# Patient Record
Sex: Female | Born: 1950 | Race: White | Hispanic: No | Marital: Single | Smoking: Former smoker
Health system: Southern US, Community
[De-identification: ages and names within clinical notes are randomized; demographics above are authoritative.]

## PROBLEM LIST (undated history)

## (undated) DIAGNOSIS — H18513 Endothelial corneal dystrophy, bilateral: Secondary | ICD-10-CM

## (undated) DIAGNOSIS — Z862 Personal history of diseases of the blood and blood-forming organs and certain disorders involving the immune mechanism: Secondary | ICD-10-CM

## (undated) DIAGNOSIS — B0222 Postherpetic trigeminal neuralgia: Secondary | ICD-10-CM

## (undated) DIAGNOSIS — M1990 Unspecified osteoarthritis, unspecified site: Secondary | ICD-10-CM

## (undated) DIAGNOSIS — G5763 Lesion of plantar nerve, bilateral lower limbs: Secondary | ICD-10-CM

## (undated) DIAGNOSIS — M810 Age-related osteoporosis without current pathological fracture: Secondary | ICD-10-CM

## (undated) DIAGNOSIS — I7300 Raynaud's syndrome without gangrene: Secondary | ICD-10-CM

## (undated) DIAGNOSIS — K629 Disease of anus and rectum, unspecified: Secondary | ICD-10-CM

## (undated) DIAGNOSIS — F419 Anxiety disorder, unspecified: Secondary | ICD-10-CM

## (undated) DIAGNOSIS — G709 Myoneural disorder, unspecified: Secondary | ICD-10-CM

## (undated) DIAGNOSIS — F32A Depression, unspecified: Secondary | ICD-10-CM

## (undated) DIAGNOSIS — R7303 Prediabetes: Secondary | ICD-10-CM

## (undated) HISTORY — PX: COLONOSCOPY: SHX174

## (undated) HISTORY — PX: FRACTURE SURGERY: SHX138

## (undated) HISTORY — DX: Age-related osteoporosis without current pathological fracture: M81.0

## (undated) HISTORY — PX: OTHER SURGICAL HISTORY: SHX169

## (undated) HISTORY — PX: LAPAROSCOPIC TUBAL LIGATION: SUR803

## (undated) HISTORY — DX: Unspecified osteoarthritis, unspecified site: M19.90

## (undated) HISTORY — DX: Anxiety disorder, unspecified: F41.9

## (undated) HISTORY — DX: Depression, unspecified: F32.A

## (undated) HISTORY — DX: Myoneural disorder, unspecified: G70.9

## (undated) HISTORY — DX: Postherpetic trigeminal neuralgia: B02.22

## (undated) HISTORY — PX: WISDOM TOOTH EXTRACTION: SHX21

---

## 1985-12-18 HISTORY — PX: LAPAROSCOPIC TUBAL LIGATION: SUR803

## 2004-12-18 DIAGNOSIS — R569 Unspecified convulsions: Secondary | ICD-10-CM

## 2004-12-18 HISTORY — DX: Unspecified convulsions: R56.9

## 2005-12-18 HISTORY — PX: HEMORRHOID BANDING: SHX5850

## 2013-12-18 DIAGNOSIS — T8859XA Other complications of anesthesia, initial encounter: Secondary | ICD-10-CM

## 2013-12-18 DIAGNOSIS — B029 Zoster without complications: Secondary | ICD-10-CM

## 2013-12-18 HISTORY — DX: Zoster without complications: B02.9

## 2013-12-18 HISTORY — PX: OTHER SURGICAL HISTORY: SHX169

## 2013-12-18 HISTORY — DX: Other complications of anesthesia, initial encounter: T88.59XA

## 2014-10-31 DIAGNOSIS — I7300 Raynaud's syndrome without gangrene: Secondary | ICD-10-CM | POA: Insufficient documentation

## 2015-01-28 DIAGNOSIS — M722 Plantar fascial fibromatosis: Secondary | ICD-10-CM | POA: Insufficient documentation

## 2015-01-28 DIAGNOSIS — R569 Unspecified convulsions: Secondary | ICD-10-CM | POA: Insufficient documentation

## 2017-12-18 HISTORY — PX: CATARACT EXTRACTION BILATERAL W/ ANTERIOR VITRECTOMY: SHX1304

## 2018-02-07 ENCOUNTER — Other Ambulatory Visit: Payer: Self-pay | Admitting: Family Medicine

## 2018-02-07 DIAGNOSIS — E2839 Other primary ovarian failure: Secondary | ICD-10-CM

## 2018-03-22 ENCOUNTER — Other Ambulatory Visit: Payer: Self-pay | Admitting: Family Medicine

## 2018-03-22 DIAGNOSIS — Z1231 Encounter for screening mammogram for malignant neoplasm of breast: Secondary | ICD-10-CM

## 2018-04-19 ENCOUNTER — Encounter: Payer: Self-pay | Admitting: Radiology

## 2018-04-19 ENCOUNTER — Ambulatory Visit
Admission: RE | Admit: 2018-04-19 | Discharge: 2018-04-19 | Disposition: A | Discharge status: Home / Self Care | Payer: Medicare PPO | Source: Ambulatory Visit | Attending: Family Medicine | Admitting: Family Medicine

## 2018-04-19 DIAGNOSIS — E2839 Other primary ovarian failure: Secondary | ICD-10-CM

## 2018-04-19 DIAGNOSIS — Z1231 Encounter for screening mammogram for malignant neoplasm of breast: Secondary | ICD-10-CM

## 2019-09-08 ENCOUNTER — Other Ambulatory Visit: Payer: Self-pay

## 2019-09-08 ENCOUNTER — Encounter: Payer: Self-pay | Admitting: Neurology

## 2019-09-08 DIAGNOSIS — R202 Paresthesia of skin: Secondary | ICD-10-CM

## 2019-10-16 ENCOUNTER — Ambulatory Visit (INDEPENDENT_AMBULATORY_CARE_PROVIDER_SITE_OTHER): Payer: Medicare HMO | Admitting: Neurology

## 2019-10-16 ENCOUNTER — Other Ambulatory Visit: Payer: Self-pay

## 2019-10-16 DIAGNOSIS — R202 Paresthesia of skin: Secondary | ICD-10-CM | POA: Diagnosis not present

## 2019-10-16 NOTE — Procedures (Signed)
[facility]  [address]  [place], [place] [address] Tel: [phone] Fax:  [phone] Test Date:  10/16/2019  Patient: Alison Rojas DOB: 06-06-1951 Physician: Narda Amber, DO  Sex: Female Height: 5\' 6"  Ref Phys: Horald Pollen, MD  ID#: [id number] Temp: 32.0C Technician:    Patient Complaints: This is a 68 year old female referred for evaluation of bilateral wrist pain peer  NCV & EMG Findings: Extensive electrodiagnostic testing of the right upper extremity and additional studies of the left shows:  1. Bilateral median, ulnar, and mixed palmar sensory responses are within normal limits. 2. Bilateral median and ulnar motor responses are within normal limits. 3. There is no evidence of active or chronic motor axonal loss changes affecting any of the tested muscles.  Motor unit configuration and recruitment pattern is within normal limits.  Impression: This is a normal study of the upper extremities.  In particular, there is no evidence of carpal tunnel syndrome or cervical radiculopathy affecting either upper extremity.   ___________________________ Narda Amber, DO    Nerve Conduction Studies Anti Sensory Summary Table   Site NR Peak (ms) Norm Peak (ms) P-T Amp (V) Norm P-T Amp  Left Median Anti Sensory (2nd Digit)  32C  Wrist    3.5 <3.8 27.7 >10  Right Median Anti Sensory (2nd Digit)  32C  Wrist    3.2 <3.8 24.4 >10  Left Ulnar Anti Sensory (5th Digit)  32C  Wrist    3.2 <3.2 25.5 >5  Right Ulnar Anti Sensory (5th Digit)  32C  Wrist    2.9 <3.2 23.9 >5   Motor Summary Table   Site NR Onset (ms) Norm Onset (ms) O-P Amp (mV) Norm O-P Amp Site1 Site2 Delta-0 (ms) Dist (cm) Vel (m/s) Norm Vel (m/s)  Left Median Motor (Abd Poll Brev)  32C  Wrist    3.4 <4.0 10.0 >5 Elbow Wrist 5.0 27.0 54 >50  Elbow    8.4  9.5         Right Median Motor (Abd Poll Brev)  32C  Wrist    3.2 <4.0 8.5 >5 Elbow Wrist 5.2 27.0 52 >50  Elbow    8.4  8.1          Left Ulnar Motor (Abd Dig Minimi)  32C  Wrist    2.7 <3.1 10.2 >7 B Elbow Wrist 4.2 25.0 60 >50  B Elbow    6.9  10.4  A Elbow B Elbow 1.7 10.0 59 >50  A Elbow    8.6  10.1         Right Ulnar Motor (Abd Dig Minimi)  32C  Wrist    2.7 <3.1 10.3 >7 B Elbow Wrist 3.9 23.0 59 >50  B Elbow    6.6  9.8  A Elbow B Elbow 1.9 10.0 53 >50  A Elbow    8.5  9.4          Comparison Summary Table   Site NR Peak (ms) Norm Peak (ms) P-T Amp (V) Site1 Site2 Delta-P (ms) Norm Delta (ms)  Left Median/Ulnar Palm Comparison (Wrist - 8cm)  32C  Median Palm    2.1 <2.2 81.0 Median Palm Ulnar Palm 0.1   Ulnar Palm    2.0 <2.2 29.4      Right Median/Ulnar Palm Comparison (Wrist - 8cm)  32C  Median Palm    2.0 <2.2 56.6 Median Palm Ulnar Palm 0.0   Ulnar Palm    2.0 <2.2 22.2  EMG   Side Muscle Ins Act Fibs Psw Fasc Number Recrt Dur Dur. Amp Amp. Poly Poly. Comment  Right 1stDorInt Nml Nml Nml Nml Nml Nml Nml Nml Nml Nml Nml Nml N/A  Right PronatorTeres Nml Nml Nml Nml Nml Nml Nml Nml Nml Nml Nml Nml N/A  Right Biceps Nml Nml Nml Nml Nml Nml Nml Nml Nml Nml Nml Nml N/A  Right Triceps Nml Nml Nml Nml Nml Nml Nml Nml Nml Nml Nml Nml N/A  Right Deltoid Nml Nml Nml Nml Nml Nml Nml Nml Nml Nml Nml Nml N/A  Left 1stDorInt Nml Nml Nml Nml Nml Nml Nml Nml Nml Nml Nml Nml N/A  Left PronatorTeres Nml Nml Nml Nml Nml Nml Nml Nml Nml Nml Nml Nml N/A  Left Biceps Nml Nml Nml Nml Nml Nml Nml Nml Nml Nml Nml Nml N/A  Left Triceps Nml Nml Nml Nml Nml Nml Nml Nml Nml Nml Nml Nml N/A  Left Deltoid Nml Nml Nml Nml Nml Nml Nml Nml Nml Nml Nml Nml N/A      Waveforms:

## 2020-04-01 ENCOUNTER — Encounter: Payer: Self-pay | Admitting: Physical Medicine and Rehabilitation

## 2020-04-28 ENCOUNTER — Encounter: Payer: Medicare HMO | Attending: Physical Medicine and Rehabilitation | Admitting: Physical Medicine and Rehabilitation

## 2020-04-28 ENCOUNTER — Encounter: Payer: Self-pay | Admitting: Physical Medicine and Rehabilitation

## 2020-04-28 ENCOUNTER — Ambulatory Visit (HOSPITAL_COMMUNITY)
Admission: RE | Admit: 2020-04-28 | Discharge: 2020-04-28 | Disposition: A | Discharge status: Home / Self Care | Payer: Medicare HMO | Source: Ambulatory Visit | Attending: Physical Medicine and Rehabilitation | Admitting: Physical Medicine and Rehabilitation

## 2020-04-28 ENCOUNTER — Other Ambulatory Visit: Payer: Self-pay

## 2020-04-28 VITALS — BP 120/79 | HR 64 | Temp 97.7°F | Ht 67.0 in | Wt 137.0 lb

## 2020-04-28 DIAGNOSIS — G4701 Insomnia due to medical condition: Secondary | ICD-10-CM | POA: Diagnosis not present

## 2020-04-28 DIAGNOSIS — B0229 Other postherpetic nervous system involvement: Secondary | ICD-10-CM | POA: Diagnosis present

## 2020-04-28 MED ORDER — AMITRIPTYLINE HCL 10 MG PO TABS: 10.0000 mg | ORAL_TABLET | Freq: Every day | ORAL | 3 refills | Status: DC

## 2020-04-28 MED ORDER — LIDOCAINE 5 % EX PTCH: 1.0000 | MEDICATED_PATCH | CUTANEOUS | 0 refills | Status: DC

## 2020-04-28 NOTE — Progress Notes (Signed)
Subjective:    Patient ID: Alison Rojas, female    DOB: 07/10/1951, 69 y.o.   MRN: [id number]  HPI Alison Rojas is a 69 year old woman who presents to establish care for post-herpetic neuralgia.  She had shingles throughout her right arm in 2015 and required hospital admission for 1 week for treatment. Following this she has had severe right sided neuropathic pain. The pain is 10/10 on average and currently 7/10, and extends from the right side of her neck down her shoulder to her lateral epicondyl, occasionally all the way to her fingers. Denies loss of strength. The pain feels sharp, burning, stabbing, and tingling.  She takes Tramadol for the pain but does not feel that this helps her much. She has tried Lyrica and Gabapentin which caused her to have memory issues. She has never tried amitriptyline.   She also has very severe insomnia. Her PCP provided her a sample medication but she is not sure she will be able to afford this medication long term. Her insomnia started after starting Lamictal for depression 1 year ago. She is currently on Wellbutrin which is helping with her depression.   She exercises a lot every day walking 3-7 miles and doing yoga and exercise classes. She gets up early in the morning to attend these classes. She tries to eat very healthy and avoids gluten. She takes many supplements and asks if they are beneficial. She asks for any alternative treatment options I can suggest.   She has never had any imaging of her cervical spine. She asks about the possibility of a nerve block. She asks about using prescription Lidocaine patch.  Pain Inventory Average Pain 10 Pain Right Now 7 My pain is sharp, burning, stabbing and tingling  In the last 24 hours, has pain interfered with the following? General activity 8 Relation with others 0 Enjoyment of life 7 What TIME of day is your pain at its worst? evening, night  Sleep (in general) Poor  Pain is worse with: inactivity Pain  improves with: heat/ice and therapy/exercise Relief from Meds: 3  Mobility ability to climb steps?  yes do you drive?  yes  Function retired Do you have any goals in this area?  yes  Neuro/Psych depression anxiety  Prior Studies new  Physicians involved in your care new   Family History  Problem Relation Age of Onset  . Cancer Mother   . Vision loss Mother   . Vision loss Father    Social History   Socioeconomic History  . Marital status: Single    Spouse name: Not on file  . Number of children: Not on file  . Years of education: Not on file  . Highest education level: Not on file  Occupational History  . Not on file  Tobacco Use  . Smoking status: Former Research scientist (life sciences)  . Smokeless tobacco: Never Used  Substance and Sexual Activity  . Alcohol use: Not on file  . Drug use: Not on file  . Sexual activity: Not on file  Other Topics Concern  . Not on file  Social History Narrative  . Not on file   Social Determinants of Health   Financial Resource Strain:   . Difficulty of Paying Living Expenses:   Food Insecurity:   . Worried About Charity fundraiser in the Last Year:   . Arboriculturist in the Last Year:   Transportation Needs:   . Film/video editor (Medical):   Marland Kitchen Lack of  Transportation (Non-Medical):   Physical Activity:   . Days of Exercise per Week:   . Minutes of Exercise per Session:   Stress:   . Feeling of Stress :   Social Connections:   . Frequency of Communication with Friends and Family:   . Frequency of Social Gatherings with Friends and Family:   . Attends Religious Services:   . Active Member of Clubs or Organizations:   . Attends Archivist Meetings:   Marland Kitchen Marital Status:    Past Surgical History:  Procedure Laterality Date  . FRACTURE SURGERY     Past Medical History:  Diagnosis Date  . Osteoporosis    BP 120/79   Pulse 64   Temp 97.7 F (36.5 C)   Ht 5\' 7"  (1.702 m)   Wt 137 lb (62.1 kg)   SpO2 97%   BMI  21.46 kg/m   Opioid Risk Score:   Fall Risk Score:  `1  Depression screen PHQ 2/9  No flowsheet data found. - Review of Systems  Constitutional: Negative.   HENT: Negative.   Eyes: Negative.   Respiratory: Negative.   Cardiovascular: Negative.   Gastrointestinal: Negative.   Endocrine: Negative.   Genitourinary: Negative.   Musculoskeletal: Positive for arthralgias, back pain and myalgias.  Skin: Negative.   Allergic/Immunologic: Negative.   Neurological: Negative.   Hematological: Negative.   Psychiatric/Behavioral: Positive for sleep disturbance.  All other systems reviewed and are negative.      Objective:   Physical Exam Gen: no distress, normal appearing HEENT: oral mucosa pink and moist, NCAT Cardio: Reg rate Chest: normal effort, normal rate of breathing Abd: soft, non-distended Ext: no edema Skin: intact Neuro: AOx3 Musculoskeletal:  Good range of motion of neck Negative Spurling's test + tenderness to palpation over right lateral masses.  +Shooting pain down right arm with palpation of right shoulder and neck muscles 5/5 strength in upper extremities.  Psych: pleasant, normal affect     Assessment & Plan:  Alison Rojas is a 69 year old woman who presents to establish care for post-herpetic neuralgia. Also suffering from severe insomnia. Also has some memory loss which may be due to the insomnia.   -Prescribed Amitriptyline 10mg  HS for pain relief, insomnia, and depression. Can uptitrate if well tolerated. Discussed side effects.  -Prescribed Lidocaine 5% patch for left upper extremity.   -Cervical Spine XR to assess for cervical pathology that may contribute to a double-crush syndrome.  -Commended her exercise and discussed role of exercise in pain relief and inflammation reduction.  -Dicussed her nutrition and supplements. Continue fish oil and turmeric for anti-inflammatory properties. Continue to avoid gluten.   -If she does not get relief from  Amitriptyline, can consider nerve block.  All questions answered. RTC in 4 weeks.

## 2020-04-28 NOTE — Patient Instructions (Addendum)
Start Amitriptyline 10mg  at night Cervical XR Start Lidoderm patch 5%

## 2020-05-04 ENCOUNTER — Encounter: Payer: Self-pay | Admitting: *Deleted

## 2020-05-04 ENCOUNTER — Telehealth: Payer: Self-pay | Admitting: *Deleted

## 2020-05-04 DIAGNOSIS — B0229 Other postherpetic nervous system involvement: Secondary | ICD-10-CM | POA: Insufficient documentation

## 2020-05-04 NOTE — Telephone Encounter (Signed)
Prior authorization submitted via covermymeds for lidocaine patch  PA Case: [id number] Status: Approved Coverage Starts on: 12/19/2019 12:00:00 AM Coverage Ends on: 12/17/2020 12:00:00 AM

## 2020-05-21 ENCOUNTER — Other Ambulatory Visit: Payer: Self-pay | Admitting: Physical Medicine and Rehabilitation

## 2020-05-26 ENCOUNTER — Encounter: Payer: Self-pay | Admitting: Physical Medicine and Rehabilitation

## 2020-05-26 ENCOUNTER — Encounter: Payer: Medicare HMO | Attending: Physical Medicine and Rehabilitation | Admitting: Physical Medicine and Rehabilitation

## 2020-05-26 ENCOUNTER — Other Ambulatory Visit: Payer: Self-pay

## 2020-05-26 VITALS — BP 113/79 | HR 68 | Temp 97.7°F | Ht 67.0 in | Wt 136.0 lb

## 2020-05-26 DIAGNOSIS — G4701 Insomnia due to medical condition: Secondary | ICD-10-CM | POA: Diagnosis present

## 2020-05-26 DIAGNOSIS — M810 Age-related osteoporosis without current pathological fracture: Secondary | ICD-10-CM

## 2020-05-26 DIAGNOSIS — B0229 Other postherpetic nervous system involvement: Secondary | ICD-10-CM

## 2020-05-26 NOTE — Progress Notes (Signed)
Subjective:    Patient ID: Alison Rojas, female    DOB: 1950-12-25, 69 y.o.   MRN: [id number]  HPI Alison Rojas is a 69 year old woman who presents for follow-up of post-herpetic neuralgia.  She had shingles throughout her right arm in 2015 and required hospital admission for 1 week for treatment. Following this she has had severe right sided neuropathic pain. The pain is 8/10 on average and currently 3/10, down from 10/8 respectively last visit. The pain extends from the right side of her neck down her shoulder to her lateral epicondyle, occasionally all the way to her fingers. Denies loss of strength. The pain feels sharp, burning, stabbing, and tingling.  She takes Tramadol for the pain but does not feel that this helps her much. She was taking three Tramadol 50mg  per day but after starting Amitriptyline last visit she has only needed to take two Tramadol per day. She has tried Lyrica and Gabapentin which caused her to have memory issues.  She also has very severe insomnia. Her PCP provided her a sample medication but she is not sure she will be able to afford this medication long term. Her insomnia started after starting Lamictal for depression 1 year ago. She is currently on Wellbutrin which is helping with her depression. The amitriptyline is also helping her insomnia. She has been able to sleep 6 hours per night after sleeping close to 0 hours for some time. She has been taking 30mg  of the Amitriptyline per night.   She exercises a lot every day walking 3-7 miles and doing yoga and exercise classes. She gets up early in the morning to attend these classes. She tries to eat very healthy and avoids gluten. She takes many supplements and asks if they are beneficial. She asks for any alternative treatment options I can suggest.   She did not try the Lidocaine patch 5% as it cost $100.  Osteoporosis: She states that she has been diagnosed with osteoporosis and has a follow-up DEXA scan tomorrow.  She takes calcium and vitamin D and has had her levels checked by her PCP. She asks if this will alter her cervical spine findings.   Pain Inventory Average Pain 8 Pain Right Now 3 My pain is intermittent and burning  In the last 24 hours, has pain interfered with the following? General activity 0 Relation with others 0 Enjoyment of life 0 What TIME of day is your pain at its worst? night Sleep (in general) Poor  Pain is worse with: inactivity and unsure Pain improves with: therapy/exercise and medication Relief from Meds: 5  Mobility walk without assistance  Function employed # of hrs/week .  Neuro/Psych depression anxiety  Prior Studies Any changes since last visit?  no  Physicians involved in your care Any changes since last visit?  no   Family History  Problem Relation Age of Onset  . Cancer Mother   . Vision loss Mother   . Vision loss Father    Social History   Socioeconomic History  . Marital status: Single    Spouse name: Not on file  . Number of children: Not on file  . Years of education: Not on file  . Highest education level: Not on file  Occupational History  . Not on file  Tobacco Use  . Smoking status: Former Research scientist (life sciences)  . Smokeless tobacco: Never Used  Substance and Sexual Activity  . Alcohol use: Not on file  . Drug use: Not on file  .  Sexual activity: Not on file  Other Topics Concern  . Not on file  Social History Narrative  . Not on file   Social Determinants of Health   Financial Resource Strain:   . Difficulty of Paying Living Expenses:   Food Insecurity:   . Worried About Charity fundraiser in the Last Year:   . Arboriculturist in the Last Year:   Transportation Needs:   . Film/video editor (Medical):   Marland Kitchen Lack of Transportation (Non-Medical):   Physical Activity:   . Days of Exercise per Week:   . Minutes of Exercise per Session:   Stress:   . Feeling of Stress :   Social Connections:   . Frequency of  Communication with Friends and Family:   . Frequency of Social Gatherings with Friends and Family:   . Attends Religious Services:   . Active Member of Clubs or Organizations:   . Attends Archivist Meetings:   Marland Kitchen Marital Status:    Past Surgical History:  Procedure Laterality Date  . FRACTURE SURGERY     Past Medical History:  Diagnosis Date  . Osteoporosis    BP 113/79   Pulse 68   Temp 97.7 F (36.5 C)   Ht 5\' 7"  (1.702 m)   Wt 136 lb (61.7 kg)   SpO2 98%   BMI 21.30 kg/m   Opioid Risk Score:   Fall Risk Score:  `1  Depression screen PHQ 2/9  Depression screen PHQ 2/9 04/28/2020  Decreased Interest 0  Down, Depressed, Hopeless 0  PHQ - 2 Score 0  Altered sleeping 3  Tired, decreased energy 2  Change in appetite 2  Feeling bad or failure about yourself  0  Trouble concentrating 1  Moving slowly or fidgety/restless 1  Suicidal thoughts 0  PHQ-9 Score 9    Review of Systems  Constitutional: Negative.   HENT: Negative.   Eyes: Negative.   Respiratory: Negative.   Cardiovascular: Negative.   Gastrointestinal: Negative.   Endocrine: Negative.   Genitourinary: Negative.   Musculoskeletal: Positive for arthralgias.  Skin: Negative.   Allergic/Immunologic: Negative.   Neurological: Negative.   Hematological: Negative.   Psychiatric/Behavioral: Negative.   All other systems reviewed and are negative.      Objective:   Physical Exam Gen: no distress, normal appearing HEENT: oral mucosa pink and moist, NCAT Cardio: Reg rate Chest: normal effort, normal rate of breathing Abd: soft, non-distended Ext: no edema Skin: intact Neuro: AOx3 Musculoskeletal:  Good range of motion of neck Negative Spurling's test + tenderness to palpation over right lateral masses.  +Shooting pain down right arm with palpation of right shoulder and neck muscles 5/5 strength in upper extremities.  Psych: more pleasant disposition after sleeping well, normal affect     Assessment & Plan:  Alison Rojas is a 69 year old woman who presents for follow-up of post-herpetic neuralgia. Also was suffering from severe insomnia. Also had some memory loss which may be due to the insomnia.    -Prescribed Amitriptyline 10mg  HS for pain relief, insomnia, and depression last visit. Had discussed that she could uptitrate if well tolerated and she did so to 30mg  with excellent benefits for all three symptoms. Discussed side effects. She is experiencing some dry mouth but this is tolerable to her. She has to wake at night to urinate so the urinary retention side effect may be beneficial to her. Increase dose to 40mg  daily. For now she will use her  10mg  tablets. If we decide to uptitrate to 50mg , I will prescribe 50mg  tablets.    -Prescribed Lidocaine 5% patch for left upper extremity but this was cost prohibitive. Advised to use OTC 4% patches instead.   -Cervical Spine XR reviewed and results discussed with the patient. 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 This is likely an incidental finding and not correlated with her neuropathic symptoms.    -Commended her exercise and discussed role of exercise in pain relief and inflammation reduction, as well as for her osteoporosis. She has had 5 children and breast fed them, which may be contributing to her current osteoporosis. Advised regarding calcium and vitamin D rich foods. She has DEXA scan tomorrow to assess status of her osteoporosis.    -Dicussed her nutrition and supplements. Continue fish oil and turmeric for anti-inflammatory properties. Continue to avoid gluten.    All questions answered. RTC in 4 weeks.

## 2020-07-26 ENCOUNTER — Telehealth: Payer: Self-pay | Admitting: *Deleted

## 2020-07-26 MED ORDER — AMITRIPTYLINE HCL 50 MG PO TABS: 50.0000 mg | ORAL_TABLET | Freq: Every day | ORAL | 5 refills | Status: DC

## 2020-07-26 NOTE — Telephone Encounter (Signed)
[name] called and is requesting a refill on her amitriptyline. She is asking for the higher dose.  It was discussed at last visit with Dr Ranell Patrick. She is requesting the 50 mg dose be sent to her pharmacy.

## 2020-07-26 NOTE — Telephone Encounter (Signed)
Renewed Elavil Rx- at 50 mg QHS-

## 2020-07-27 NOTE — Telephone Encounter (Signed)
Notified. 

## 2020-08-21 ENCOUNTER — Other Ambulatory Visit: Payer: Self-pay | Admitting: Physical Medicine and Rehabilitation

## 2020-08-25 ENCOUNTER — Encounter: Payer: Medicare HMO | Attending: Physical Medicine and Rehabilitation | Admitting: Physical Medicine and Rehabilitation

## 2020-08-25 ENCOUNTER — Encounter: Payer: Self-pay | Admitting: Physical Medicine and Rehabilitation

## 2020-08-25 ENCOUNTER — Other Ambulatory Visit: Payer: Self-pay

## 2020-08-25 VITALS — BP 124/80 | HR 63 | Temp 98.2°F | Ht 67.0 in | Wt 132.4 lb

## 2020-08-25 DIAGNOSIS — G4701 Insomnia due to medical condition: Secondary | ICD-10-CM | POA: Insufficient documentation

## 2020-08-25 DIAGNOSIS — M810 Age-related osteoporosis without current pathological fracture: Secondary | ICD-10-CM

## 2020-08-25 DIAGNOSIS — B0229 Other postherpetic nervous system involvement: Secondary | ICD-10-CM | POA: Diagnosis not present

## 2020-08-25 MED ORDER — DESIPRAMINE HCL 25 MG PO TABS: 25.0000 mg | ORAL_TABLET | Freq: Every day | ORAL | 3 refills | Status: DC

## 2020-08-25 NOTE — Progress Notes (Signed)
Subjective:    Patient ID: Alison Rojas, female    DOB: 1951-07-25, 69 y.o.   MRN: [id number]  HPI  Alison Rojas is a 69 year old woman who presents for follow-up of post-herpetic neuralgia.   She does yoga at 7:15 in the morning with others. She cannot take Amitriptyline the nights before. Last week she did not take the Amitritptyline at all. She takes it 5 days per week. Definitely on weekends.   She also is experiencing constipation with the Amitriptyline.   When she takes Miralax and psylium she is able to have a BM every morning.   She still wakes up 2-3 times per night wit the Amitriptyline.   Pain Inventory Average Pain 7 Pain Right Now 3 My pain is constant, sharp and burning  In the last 24 hours, has pain interfered with the following? General activity 3 Relation with others 0 Enjoyment of life 5 What TIME of day is your pain at its worst? evening and night Sleep (in general) Poor  Pain is worse with: inactivity Pain improves with: rest and therapy/exercise Relief from Meds: 5  Family History  Problem Relation Age of Onset   Cancer Mother    Vision loss Mother    Vision loss Father    Social History   Socioeconomic History   Marital status: Single    Spouse name: Not on file   Number of children: Not on file   Years of education: Not on file   Highest education level: Not on file  Occupational History   Not on file  Tobacco Use   Smoking status: Former Smoker   Smokeless tobacco: Never Used  Scientific laboratory technician Use: Never used  Substance and Sexual Activity   Alcohol use: Yes   Drug use: Not Currently   Sexual activity: Not on file  Other Topics Concern   Not on file  Social History Narrative   Not on file   Social Determinants of Health   Financial Resource Strain:    Difficulty of Paying Living Expenses: Not on file  Food Insecurity:    Worried About Charity fundraiser in the Last Year: Not on file   [employer] of Food in  the Last Year: Not on file  Transportation Needs:    Lack of Transportation (Medical): Not on file   Lack of Transportation (Non-Medical): Not on file  Physical Activity:    Days of Exercise per Week: Not on file   Minutes of Exercise per Session: Not on file  Stress:    Feeling of Stress : Not on file  Social Connections:    Frequency of Communication with Friends and Family: Not on file   Frequency of Social Gatherings with Friends and Family: Not on file   Attends Religious Services: Not on file   Active Member of Clubs or Organizations: Not on file   Attends Archivist Meetings: Not on file   Marital Status: Not on file   Past Surgical History:  Procedure Laterality Date   FRACTURE SURGERY     Past Surgical History:  Procedure Laterality Date   FRACTURE SURGERY     Past Medical History:  Diagnosis Date   Osteoporosis    BP 124/80    Pulse 63    Temp 98.2 F (36.8 C)    Ht 5\' 7"  (1.702 m)    Wt 132 lb 6.4 oz (60.1 kg)    SpO2 97%    BMI  20.74 kg/m   Opioid Risk Score:   Fall Risk Score:  `1  Depression screen PHQ 2/9  Depression screen PHQ 2/9 04/28/2020  Decreased Interest 0  Down, Depressed, Hopeless 0  PHQ - 2 Score 0  Altered sleeping 3  Tired, decreased energy 2  Change in appetite 2  Feeling bad or failure about yourself  0  Trouble concentrating 1  Moving slowly or fidgety/restless 1  Suicidal thoughts 0  PHQ-9 Score 9   Review of Systems  HENT: Negative.   Eyes: Negative.   Respiratory: Negative.   Cardiovascular: Negative.   Endocrine: Negative.   Genitourinary: Negative.   Musculoskeletal: Positive for joint swelling and neck pain.       Left arm & shoulder, left hand  Skin: Negative.   Allergic/Immunologic: Negative.   Neurological: Negative.   Hematological: Negative.   Psychiatric/Behavioral: Negative.   All other systems reviewed and are negative.      Objective:   Physical Exam Gen: no distress, normal  appearing HEENT: oral mucosa pink and moist, NCAT Cardio: Reg rate Chest: normal effort, normal rate of breathing Abd: soft, non-distended Ext: no edema Neuro:AOx3 Musculoskeletal: Good range of motion of neck Negative Spurling's test + tenderness to palpation over right lateral masses.  +Shooting pain down right arm with palpation of right shoulder and neck muscles 5/5 strength in upper extremities. Psych: more pleasant disposition after sleeping well, normal affect    Assessment & Plan:  Alison Rojas is a 69 year old woman who presents for follow-up of post-herpetic neuralgia. Also was suffering from severe insomnia. Also had some memory loss which may be due to the insomnia.   1) Post herpetic neuralgia 2) Insomnia -Since Amitriptyline is constipating patient, will trial Desipramine instead. Prescribed 25mg  tablets but she may take up to 50mg  if needed. This should help with pain relief and insomnia.  -Discussed [employer] PNS system as an option of pain treatment via neuromodulation. Provided following link for patient to learn more about the system: [email or web address] Will schedule next visit for her left sided post-herpetic neuralgia. Her goals are pain relief and to get off her Tramadol.   -Prescribed Lidocaine 5% patch for left upper extremity but this was cost prohibitive. Advised to use OTC 4% patches instead. This has been working well for her.   -Cervical Spine XR reviewed and results discussed with the patient. 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 This is likely an incidental finding and not correlated with her neuropathic symptoms.   -Commended her exercise and discussed role of exercise in pain relief and inflammation reduction, as well as for her osteoporosis. She has had 5 children and breast fed them, which may be contributing to her current osteoporosis. Advised regarding calcium and  vitamin D rich foods.    -Dicussed her nutrition and supplements. Continue fish oil and turmeric for anti-inflammatory properties. Continue to avoid gluten.   -Discussed sleepy hygiene. She will be getting blue blocker glasses soon  3) Constipation: prunes, high fiber diet, colace TID PRN and Senna HS prn. Advised regarding risk of dependence on laxatives.     All questions answered. RTC in 4 weeks.

## 2020-08-25 NOTE — Patient Instructions (Signed)
[email or web address]

## 2020-09-29 ENCOUNTER — Other Ambulatory Visit: Payer: Self-pay

## 2020-09-29 ENCOUNTER — Encounter: Payer: Medicare HMO | Attending: Physical Medicine and Rehabilitation | Admitting: Physical Medicine and Rehabilitation

## 2020-09-29 ENCOUNTER — Encounter: Payer: Self-pay | Admitting: Physical Medicine and Rehabilitation

## 2020-09-29 VITALS — BP 117/75 | HR 71 | Temp 98.0°F | Ht 67.0 in | Wt 135.0 lb

## 2020-09-29 DIAGNOSIS — B0229 Other postherpetic nervous system involvement: Secondary | ICD-10-CM | POA: Insufficient documentation

## 2020-09-29 DIAGNOSIS — K5903 Drug induced constipation: Secondary | ICD-10-CM | POA: Diagnosis present

## 2020-09-29 DIAGNOSIS — G4701 Insomnia due to medical condition: Secondary | ICD-10-CM | POA: Diagnosis present

## 2020-09-29 NOTE — Progress Notes (Signed)
Subjective:    Patient ID: Alison Rojas, female    DOB: 1951/04/27, 69 y.o.   MRN: [id number]  HPI  Alison Rojas is a 69 year old woman who presents for follow-up of post-herpetic neuralgia.   Her current pain is well controlled at 2/10 with the desipramine. When she exercises she feels no pain. When she stops the pain resumes and it is present especially at night. The Desipramine helps with the pain and also helps her to sleep but makes her very constipated. She would like to know what her copay is for the SPRINT PNS before pursuing it.   She does yoga at 7:15 in the morning with friends.   She does take fiber for her constipation but it is still quite bothersome to her.   When she takes Miralax and psylium she is able to have a BM every morning.   She still woke frequently with the Desimpramine.   Pain Inventory Average Pain 7 Pain Right Now 3 My pain is constant, sharp and burning  In the last 24 hours, has pain interfered with the following? General activity 3 Relation with others 0 Enjoyment of life 5 What TIME of day is your pain at its worst? evening and night Sleep (in general) Poor  Pain is worse with: inactivity Pain improves with: rest and therapy/exercise Relief from Meds: 5  Family History  Problem Relation Age of Onset  . Cancer Mother   . Vision loss Mother   . Vision loss Father    Social History   Socioeconomic History  . Marital status: Single    Spouse name: Not on file  . Number of children: Not on file  . Years of education: Not on file  . Highest education level: Not on file  Occupational History  . Not on file  Tobacco Use  . Smoking status: Former Research scientist (life sciences)  . Smokeless tobacco: Never Used  Vaping Use  . Vaping Use: Never used  Substance and Sexual Activity  . Alcohol use: Yes  . Drug use: Not Currently  . Sexual activity: Not on file  Other Topics Concern  . Not on file  Social History Narrative  . Not on file   Social Determinants of  Health   Financial Resource Strain:   . Difficulty of Paying Living Expenses: Not on file  Food Insecurity:   . Worried About Charity fundraiser in the Last Year: Not on file  . Ran Out of Food in the Last Year: Not on file  Transportation Needs:   . Lack of Transportation (Medical): Not on file  . Lack of Transportation (Non-Medical): Not on file  Physical Activity:   . Days of Exercise per Week: Not on file  . Minutes of Exercise per Session: Not on file  Stress:   . Feeling of Stress : Not on file  Social Connections:   . Frequency of Communication with Friends and Family: Not on file  . Frequency of Social Gatherings with Friends and Family: Not on file  . Attends Religious Services: Not on file  . Active Member of Clubs or Organizations: Not on file  . Attends Archivist Meetings: Not on file  . Marital Status: Not on file   Past Surgical History:  Procedure Laterality Date  . FRACTURE SURGERY     Past Surgical History:  Procedure Laterality Date  . FRACTURE SURGERY     Past Medical History:  Diagnosis Date  . Osteoporosis  BP 117/75   Pulse 71   Temp 98 F (36.7 C)   Ht 5\' 7"  (1.702 m)   Wt 135 lb (61.2 kg)   SpO2 97%   BMI 21.14 kg/m   Opioid Risk Score:   Fall Risk Score:  `1  Depression screen PHQ 2/9  Depression screen [facility] 2/9 08/25/2020 04/28/2020  Decreased Interest 0 0  Down, Depressed, Hopeless 0 0  PHQ - 2 Score 0 0  Altered sleeping - 3  Tired, decreased energy - 2  Change in appetite - 2  Feeling bad or failure about yourself  - 0  Trouble concentrating - 1  Moving slowly or fidgety/restless - 1  Suicidal thoughts - 0  PHQ-9 Score - 9   Review of Systems  HENT: Negative.   Eyes: Negative.   Respiratory: Negative.   Cardiovascular: Negative.   Endocrine: Negative.   Genitourinary: Negative.   Musculoskeletal: Positive for joint swelling and neck pain.       Left arm & shoulder, left hand  Skin: Negative.     Allergic/Immunologic: Negative.   Neurological: Negative.   Hematological: Negative.   Psychiatric/Behavioral: Negative.   All other systems reviewed and are negative.      Objective:   Physical Exam Gen: no distress, normal appearing HEENT: oral mucosa pink and moist, NCAT Cardio: Reg rate Chest: normal effort, normal rate of breathing Abd: soft, non-distended Ext: no edema Skin: intact Neuro:AOx3 Musculoskeletal: Good range of motion of neck Negative Spurling's test + tenderness to palpation over right lateral masses.  +Shooting pain down right arm with palpation of right shoulder and neck muscles 5/5 strength in upper extremities. Psych: more pleasant disposition after sleeping well, normal affect     Assessment & Plan:  Alison Rojas is a 69 year old woman who presents for follow-up of post-herpetic neuralgia. Also was suffering from severe insomnia. Also had some memory loss which may be due to the insomnia.   1) Post herpetic neuralgia 2) Insomnia -Desipramine also constipated Alison Rojas. She is going to stop taking the Desipramine and see if pain recurs. -She called her insurance company to determine copay and would like to proceed with SPRINT PNS. Will schedule for next visit.   -Discussed Sprint PNS system as an option of pain treatment via neuromodulation. Provided following link for patient to learn more about the system: [email or web address].sprtherapeutics.com/. Will schedule next visit for her left sided post-herpetic neuralgia. Her goals are pain relief and to get off her Tramadol and Desipramine which are constipating her. Will target axillary nerve.  -Prescribed Lidocaine 5% patch for left upper extremity but this was cost prohibitive. Advised to use OTC 4% patches instead. This has been working well for her.   -Cervical Spine XR reviewed and results discussed with the patient. 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 This is likely an incidental finding and not correlated with her neuropathic symptoms.   -Commended her exercise and discussed role of exercise in pain relief and inflammation reduction, as well as for her osteoporosis. She has had 5 children and breast fed them, which may be contributing to her current osteoporosis. Advised regarding calcium and vitamin D rich foods.    -Dicussed her nutrition and supplements. Continue fish oil and turmeric for anti-inflammatory properties. Continue to avoid gluten.   -Discussed sleepy hygiene. She will be getting blue blocker glasses soon.  3) Constipation: prunes, high fiber diet, colace TID  PRN and Senna HS prn. Advised regarding risk of dependence on laxatives.     All questions answered. RTC in 4 weeks.

## 2020-10-26 ENCOUNTER — Encounter: Payer: Medicare HMO | Attending: Physical Medicine and Rehabilitation | Admitting: Physical Medicine and Rehabilitation

## 2020-10-26 ENCOUNTER — Other Ambulatory Visit: Payer: Self-pay

## 2020-10-26 ENCOUNTER — Encounter: Payer: Self-pay | Admitting: Physical Medicine and Rehabilitation

## 2020-10-26 VITALS — BP 118/82 | HR 64 | Temp 98.0°F | Ht 67.0 in | Wt 138.0 lb

## 2020-10-26 DIAGNOSIS — B0229 Other postherpetic nervous system involvement: Secondary | ICD-10-CM | POA: Diagnosis not present

## 2020-10-26 NOTE — Progress Notes (Signed)
Procedure: SPRINT PNS placement. Location: Left axillary nerve Indication: Post-herpetic neuralgia x6 years that has failed conservative care  Risks and benefits of procedure were discussed with the patient and consent was obtained.  The site was anesthetized with 1 cc lidocaine. Needle injected lateral to medial and stimulation resulted in deltoid movement and paresthesias in the appropriate distribution. Pulse generator was placed on the lateral arm.  Patient tolerated the procedure well and post-procedure instructions were given.

## 2020-12-30 ENCOUNTER — Ambulatory Visit: Payer: Medicare HMO | Admitting: Physical Medicine and Rehabilitation

## 2021-01-12 ENCOUNTER — Telehealth: Payer: Self-pay

## 2021-01-12 DIAGNOSIS — G4701 Insomnia due to medical condition: Secondary | ICD-10-CM

## 2021-01-12 MED ORDER — AMITRIPTYLINE HCL 50 MG PO TABS: ORAL_TABLET | ORAL | 2 refills | Status: DC

## 2021-01-12 NOTE — Telephone Encounter (Signed)
Refill request Amitriptyline

## 2021-05-10 DIAGNOSIS — H18513 Endothelial corneal dystrophy, bilateral: Secondary | ICD-10-CM | POA: Insufficient documentation

## 2021-09-21 LAB — COLOGUARD: COLOGUARD: POSITIVE — AB

## 2021-10-06 ENCOUNTER — Ambulatory Visit (AMBULATORY_SURGERY_CENTER): Payer: Medicare Other

## 2021-10-06 ENCOUNTER — Other Ambulatory Visit: Payer: Self-pay

## 2021-10-06 VITALS — Ht 67.0 in | Wt 144.0 lb

## 2021-10-06 DIAGNOSIS — R195 Other fecal abnormalities: Secondary | ICD-10-CM

## 2021-10-06 MED ORDER — NA SULFATE-K SULFATE-MG SULF 17.5-3.13-1.6 GM/177ML PO SOLN: 1.0000 | Freq: Once | ORAL | 0 refills | Status: AC

## 2021-10-06 NOTE — Progress Notes (Signed)
Pre visit completed via phone call; Patient verified name, DOB, and address; No egg or soy allergy known to patient.  No issues known to pt with past sedation with any surgeries or procedures Patient denies ever being told they had issues or difficulty with intubation  No FH of Malignant Hyperthermia Pt is not on diet pills Pt is not on  home 02  Pt is not on blood thinners  Pt reports issues with constipation at this time- patient reports taking Dulcolax, increases fluids, increase veggies, and exercises; No A fib or A flutter Pt is fully vaccinated for Covid x 2; NO PA's for preps discussed with pt in PV today  Discussed with pt there will be an out-of-pocket cost for prep and that varies from $0 to 70 +  dollars - pt verbalized understanding  Due to the COVID-19 pandemic we are asking patients to follow certain guidelines in PV and the [place]   Pt aware of COVID protocols and [facility] guidelines

## 2021-10-21 ENCOUNTER — Encounter: Payer: Medicare HMO | Admitting: Gastroenterology

## 2021-10-27 ENCOUNTER — Encounter: Payer: Self-pay | Admitting: Gastroenterology

## 2021-11-24 ENCOUNTER — Ambulatory Visit (AMBULATORY_SURGERY_CENTER): Payer: Medicare Other | Admitting: Gastroenterology

## 2021-11-24 ENCOUNTER — Encounter: Payer: Self-pay | Admitting: Gastroenterology

## 2021-11-24 VITALS — BP 107/67 | HR 48 | Temp 97.5°F | Resp 13 | Ht 67.0 in | Wt 144.0 lb

## 2021-11-24 DIAGNOSIS — K5730 Diverticulosis of large intestine without perforation or abscess without bleeding: Secondary | ICD-10-CM

## 2021-11-24 DIAGNOSIS — R195 Other fecal abnormalities: Secondary | ICD-10-CM | POA: Diagnosis not present

## 2021-11-24 DIAGNOSIS — D128 Benign neoplasm of rectum: Secondary | ICD-10-CM | POA: Diagnosis not present

## 2021-11-24 DIAGNOSIS — D120 Benign neoplasm of cecum: Secondary | ICD-10-CM

## 2021-11-24 DIAGNOSIS — K6289 Other specified diseases of anus and rectum: Secondary | ICD-10-CM

## 2021-11-24 DIAGNOSIS — K648 Other hemorrhoids: Secondary | ICD-10-CM | POA: Diagnosis not present

## 2021-11-24 HISTORY — PX: COLONOSCOPY: SHX174

## 2021-11-24 MED ORDER — SODIUM CHLORIDE 0.9 % IV SOLN: 500.0000 mL | Freq: Once | INTRAVENOUS | Status: DC

## 2021-11-24 NOTE — Progress Notes (Signed)
PT taken to PACU. Monitors in place. VSS. Report given to RN. 

## 2021-11-24 NOTE — Op Note (Signed)
[place] Patient Name: Alison Rojas Procedure Date: 11/24/2021 11:51 AM MRN: [id number] Endoscopist: Mauri Pole , MD Age: 70 Referring MD:  Date of Birth: 1951-02-05 Gender: Female Account #: [id number] Procedure:                Colonoscopy Indications:              Positive Cologuard test Medicines:                Monitored Anesthesia Care Procedure:                Pre-Anesthesia Assessment:                           - Prior to the procedure, a History and Physical                            was performed, and patient medications and                            allergies were reviewed. The patient's tolerance of                            previous anesthesia was also reviewed. The risks                            and benefits of the procedure and the sedation                            options and risks were discussed with the patient.                            All questions were answered, and informed consent                            was obtained. Prior Anticoagulants: The patient has                            taken no previous anticoagulant or antiplatelet                            agents. ASA Grade Assessment: II - A patient with                            mild systemic disease. After reviewing the risks                            and benefits, the patient was deemed in                            satisfactory condition to undergo the procedure.                           After obtaining informed consent, the colonoscope  was passed under direct vision. Throughout the                            procedure, the patient's blood pressure, pulse, and                            oxygen saturations were monitored continuously. The                            PCF-HQ190L Colonoscope was introduced through the                            anus and advanced to the the cecum, identified by                            appendiceal orifice and ileocecal  valve. The                            colonoscopy was performed without difficulty. The                            patient tolerated the procedure well. The quality                            of the bowel preparation was good. The terminal                            ileum, ileocecal valve, appendiceal orifice, and                            rectum were photographed. Scope In: 12:04:27 PM Scope Out: 12:27:52 PM Scope Withdrawal Time: 0 hours 13 minutes 3 seconds  Total Procedure Duration: 0 hours 23 minutes 25 seconds  Findings:                 The digital rectal exam revealed a rectal mass.                           Scattered small-mouthed diverticula were found in                            the sigmoid colon, descending colon, transverse                            colon and ascending colon.                           A 4 mm polyp was found in the ileocecal valve. The                            polyp was sessile. The polyp was removed with a                            cold snare. Resection and retrieval were complete.  An infiltrative non-obstructing mass was found in                            the distal rectum. The mass was partially                            circumferential (involving one-third of the lumen                            circumference). The mass measured one cm in length.                            In addition, its diameter measured fifteen mm. No                            bleeding was present. Biopsies were taken with a                            cold forceps for histology. Proximal fold area was                            tattooed with an injection of 1 mL of Spot (carbon                            black).                           Non-bleeding external and internal hemorrhoids were                            found during retroflexion. The hemorrhoids were                            small. Complications:            No immediate  complications. Estimated Blood Loss:     Estimated blood loss was minimal. Impression:               - Rectal mass.                           - Diverticulosis in the sigmoid colon, in the                            descending colon, in the transverse colon and in                            the ascending colon.                           - One 4 mm polyp at the ileocecal valve, removed                            with a cold snare. Resected and retrieved.                           -  Rule out malignancy, tumor in the distal rectum.                            Biopsied. Tattooed.                           - Non-bleeding external and internal hemorrhoids. Recommendation:           - Patient has a contact number available for                            emergencies. The signs and symptoms of potential                            delayed complications were discussed with the                            patient. Return to normal activities tomorrow.                            Written discharge instructions were provided to the                            patient.                           - Resume previous diet.                           - Continue present medications.                           - Await pathology results.                           - Repeat colonoscopy date to be determined after                            pending pathology results are reviewed for                            surveillance based on pathology results. Mauri Pole, MD 11/24/2021 12:35:24 PM This report has been signed electronically.

## 2021-11-24 NOTE — Progress Notes (Signed)
Called to room to assist during endoscopic procedure.  Patient ID and intended procedure confirmed with present staff. Received instructions for my participation in the procedure from the performing physician.  

## 2021-11-24 NOTE — Progress Notes (Signed)
VS completed by DT.   Medical and surgical history reviewed and updated.  

## 2021-11-24 NOTE — Progress Notes (Signed)
[facility] History and Physical   Primary Care Physician:  Hayden Rasmussen, MD   Reason for Procedure:  Positive Cologuard  Plan:    Colonoscopy with possible interventions as needed     HPI: Alison Rojas is a very pleasant 70 y.o. female here for colonoscopy for positive cologuard. Denies any nausea, vomiting, abdominal pain, melena or bright red blood per rectum  The risks and benefits as well as alternatives of endoscopic procedure(s) have been discussed and reviewed. All questions answered. The patient agrees to proceed.    Past Medical History:  Diagnosis Date   Anxiety    on meds   Arthritis    Depression    on meds   Neuromuscular disorder ([place])    Osteoporosis    Post-herpetic trigeminal neuralgia     Past Surgical History:  Procedure Laterality Date   HEMORRHOID BANDING  2007   LAPAROSCOPIC TUBAL LIGATION     WISDOM TOOTH EXTRACTION      Prior to Admission medications   Medication Sig Start Date End Date Taking? Authorizing Provider  buPROPion (WELLBUTRIN XL) 150 MG 24 hr tablet Take 150 mg by mouth every morning. 10/04/21   [provider]  buPROPion (WELLBUTRIN XL) 300 MG 24 hr tablet Take 300 mg by mouth daily.    [provider]  CALCIUM CITRATE-VITAMIN D PO Take 1 tablet by mouth daily at 6 (six) AM.    [provider]  Ferrous Sulfate (IRON PO) Take 1 tablet by mouth daily at 6 (six) AM.    [provider]  Multiple Vitamin (MULTIVITAMIN) capsule Take 1 capsule by mouth daily. gummie    [provider]  Multiple Vitamins-Minerals (PRESERVISION AREDS PO) Take 1 capsule by mouth daily at 6 (six) AM.    [provider]  Omega-3 Fatty Acids (FISH OIL) 1000 MG CAPS Take 1 capsule by mouth daily at 6 (six) AM.    [provider]  prednisoLONE acetate (PRED FORTE) 1 % ophthalmic suspension Place 1 drop into the left eye 3 (three) times daily. 08/13/21   [provider]  sodium  chloride (MURO 128) 5 % ophthalmic solution Place 1 drop into both eyes 3 (three) times daily.    [provider]  vitamin B-12 (CYANOCOBALAMIN) 500 MCG tablet Take 500 mcg by mouth daily.    [provider]    Current Outpatient Medications  Medication Sig Dispense Refill   buPROPion (WELLBUTRIN XL) 150 MG 24 hr tablet Take 150 mg by mouth every morning.     buPROPion (WELLBUTRIN XL) 300 MG 24 hr tablet Take 300 mg by mouth daily.     CALCIUM CITRATE-VITAMIN D PO Take 1 tablet by mouth daily at 6 (six) AM.     Ferrous Sulfate (IRON PO) Take 1 tablet by mouth daily at 6 (six) AM.     Multiple Vitamin (MULTIVITAMIN) capsule Take 1 capsule by mouth daily. gummie     Multiple Vitamins-Minerals (PRESERVISION AREDS PO) Take 1 capsule by mouth daily at 6 (six) AM.     Omega-3 Fatty Acids (FISH OIL) 1000 MG CAPS Take 1 capsule by mouth daily at 6 (six) AM.     prednisoLONE acetate (PRED FORTE) 1 % ophthalmic suspension Place 1 drop into the left eye 3 (three) times daily.     sodium chloride (MURO 128) 5 % ophthalmic solution Place 1 drop into both eyes 3 (three) times daily.     vitamin B-12 (CYANOCOBALAMIN) 500 MCG tablet Take  500 mcg by mouth daily.     No current facility-administered medications for this visit.    Allergies as of 11/24/2021 - Review Complete 10/06/2021  Allergen Reaction Noted   Codeine Shortness Of Breath 07/31/2018   Dairycare [lactase-lactobacillus] Diarrhea and Nausea And Vomiting 10/06/2021   Gluten meal Nausea And Vomiting 07/25/2021    Family History  Problem Relation Age of Onset   Cancer Mother    Vision loss Mother    Vision loss Father    Colon polyps Neg Hx    Colon cancer Neg Hx    Esophageal cancer Neg Hx    Rectal cancer Neg Hx    Stomach cancer Neg Hx     Social History   Socioeconomic History   Marital status: Single    Spouse name: Not on file   Number of children: Not on file   Years of education: Not on file   Highest  education level: Not on file  Occupational History   Not on file  Tobacco Use   Smoking status: Former   Smokeless tobacco: Never  Vaping Use   Vaping Use: Never used  Substance and Sexual Activity   Alcohol use: Not Currently   Drug use: Not Currently    Frequency: 7.0 times per week    Types: Marijuana   Sexual activity: Not on file  Other Topics Concern   Not on file  Social History Narrative   Not on file   Social Determinants of Health   Financial Resource Strain: Not on file  Food Insecurity: Not on file  Transportation Needs: Not on file  Physical Activity: Not on file  Stress: Not on file  Social Connections: Not on file  Intimate Partner Violence: Not on file    Review of Systems:  All other review of systems negative except as mentioned in the HPI.  Physical Exam: Vital signs in last 24 hours: BP 95/66   Pulse 65   Temp (!) 97.5 F (36.4 C) (Temporal)   Ht 5\' 7"  (1.702 m)   Wt 144 lb (65.3 kg)   SpO2 99%   BMI 22.55 kg/m  General:   Alert, NAD Lungs:  Clear .   Heart:  Regular rate and rhythm Abdomen:  Soft, nontender and nondistended. Neuro/Psych:  Alert and cooperative. Normal mood and affect. A and O x 3  Reviewed labs, radiology imaging, old records and pertinent past GI work up  Patient is appropriate for planned procedure(s) and anesthesia in an ambulatory setting   K. Denzil Magnuson , MD [phone]

## 2021-11-24 NOTE — Patient Instructions (Signed)
Thank you for allowing [place] to take care of you today.  Please review all handouts given to you by your recovery nurse.    YOU HAD AN ENDOSCOPIC PROCEDURE TODAY AT [facility]:   Refer to the procedure report that was given to you for any specific questions about what was found during the examination.  If the procedure report does not answer your questions, please call your gastroenterologist to clarify.  If you requested that your care partner not be given the details of your procedure findings, then the procedure report has been included in a sealed envelope for you to review at your convenience later.  YOU SHOULD EXPECT: Some feelings of bloating in the abdomen. Passage of more gas than usual.  Walking can help get rid of the air that was put into your GI tract during the procedure and reduce the bloating. If you had a lower endoscopy (such as a colonoscopy or flexible sigmoidoscopy) you may notice spotting of blood in your stool or on the toilet paper. If you underwent a bowel prep for your procedure, you may not have a normal bowel movement for a few days.  Please Note:  You might notice some irritation and congestion in your nose or some drainage.  This is from the oxygen used during your procedure.  There is no need for concern and it should clear up in a day or so.  SYMPTOMS TO REPORT IMMEDIATELY:  Following lower endoscopy (colonoscopy or flexible sigmoidoscopy):  Excessive amounts of blood in the stool  Significant tenderness or worsening of abdominal pains  Swelling of the abdomen that is new, acute  Fever of 100F or higher   For urgent or emergent issues, a gastroenterologist can be reached at any hour by calling [phone]. Do not use MyChart messaging for urgent concerns.    DIET:  We do recommend a small meal at first, but then you may proceed to your regular diet.  Drink plenty of fluids but you should avoid alcoholic beverages for 24 hours.  ACTIVITY:  You  should plan to take it easy for the rest of today and you should NOT DRIVE or use heavy machinery until tomorrow (because of the sedation medicines used during the test).    FOLLOW UP: Our staff will call the number listed on your records 48-72 hours following your procedure to check on you and address any questions or concerns that you may have regarding the information given to you following your procedure. If we do not reach you, we will leave a message.  We will attempt to reach you two times.  During this call, we will ask if you have developed any symptoms of COVID 19. If you develop any symptoms (ie: fever, flu-like symptoms, shortness of breath, cough etc.) before then, please call [phone].  If you test positive for Covid 19 in the 2 weeks post procedure, please call and report this information to [place].    If any biopsies were taken you will be contacted by phone or by letter within the next 1-3 weeks.  Please call us at [phone] if you have not heard about the biopsies in 3 weeks.    SIGNATURES/CONFIDENTIALITY: You and/or your care partner have signed paperwork which will be entered into your electronic medical record.  These signatures attest to the fact that that the information above on your After Visit Summary has been reviewed and is understood.  Full responsibility of the confidentiality of this discharge  information lies with you and/or your care-partner. 

## 2021-11-28 ENCOUNTER — Telehealth: Payer: Self-pay

## 2021-11-28 ENCOUNTER — Telehealth: Payer: Self-pay | Admitting: *Deleted

## 2021-11-28 ENCOUNTER — Encounter: Payer: Self-pay | Admitting: Gastroenterology

## 2021-11-28 DIAGNOSIS — K6289 Other specified diseases of anus and rectum: Secondary | ICD-10-CM

## 2021-11-28 DIAGNOSIS — D374 Neoplasm of uncertain behavior of colon: Secondary | ICD-10-CM

## 2021-11-28 NOTE — Telephone Encounter (Signed)
Second attempt follow up call to pt, no answer.  

## 2021-11-28 NOTE — Telephone Encounter (Signed)
First post procedure follow up call, no answer 

## 2021-11-28 NOTE — Telephone Encounter (Addendum)
Left message for pt to call back also l/m about her MRI that is tomorrow 12/13 at 4pm she needs to arrive 15 minutes early NPO 4 hours

## 2021-11-28 NOTE — Telephone Encounter (Signed)
As part of our Prior Authorization Reduction program, [employer] no longer requires prior authorization for CT, MRI/MRA and transthoracic echocardiography procedures for Medicare members effective 12/18/2016*. If you have general questions about prior authorization requirements, please call customer service at [phone]. *Medicare members include Medicare Advantage, Duals, MMP and [employer] SCO benefit plans.    This is about her authorization for the MRI

## 2021-11-28 NOTE — Telephone Encounter (Signed)
Called patient and discussed results.  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  Will need to exclude any submucosal issue involvement. Please schedule urgent MRI pelvis and referral to colorectal surgeon ([facility]) for removal of the lesion.  Recall colonoscopy in 1 year

## 2021-11-29 ENCOUNTER — Other Ambulatory Visit: Payer: Self-pay

## 2021-11-29 ENCOUNTER — Ambulatory Visit (HOSPITAL_COMMUNITY)
Admission: RE | Admit: 2021-11-29 | Discharge: 2021-11-29 | Disposition: A | Discharge status: Home / Self Care | Payer: Medicare Other | Source: Ambulatory Visit | Attending: Gastroenterology | Admitting: Gastroenterology

## 2021-11-29 DIAGNOSIS — D374 Neoplasm of uncertain behavior of colon: Secondary | ICD-10-CM | POA: Diagnosis present

## 2021-11-29 DIAGNOSIS — K6289 Other specified diseases of anus and rectum: Secondary | ICD-10-CM | POA: Insufficient documentation

## 2021-11-29 MED ORDER — GADOBUTROL 1 MMOL/ML IV SOLN
7.0000 mL | Freq: Once | INTRAVENOUS | Status: AC | PRN
  Administered 2021-11-29: 7 mL via INTRAVENOUS

## 2021-11-30 ENCOUNTER — Telehealth: Payer: Self-pay | Admitting: Gastroenterology

## 2021-11-30 NOTE — Telephone Encounter (Signed)
Called patient, unable to reach. Left a detailed message. Please refer to result note

## 2021-11-30 NOTE — Telephone Encounter (Signed)
Patient returned your call from earlier today regarding MRI results.  Please call at your convenience.

## 2021-12-01 NOTE — Telephone Encounter (Signed)
Referral faxed today.

## 2021-12-22 NOTE — Telephone Encounter (Signed)
Patient has an appointment to see Dr Marcello Moores on 01/09/2022 Monday at 10:30am

## 2022-01-03 DIAGNOSIS — I10 Essential (primary) hypertension: Secondary | ICD-10-CM | POA: Insufficient documentation

## 2022-01-03 DIAGNOSIS — E119 Type 2 diabetes mellitus without complications: Secondary | ICD-10-CM | POA: Insufficient documentation

## 2022-01-09 ENCOUNTER — Ambulatory Visit: Payer: Self-pay | Admitting: General Surgery

## 2022-01-09 DIAGNOSIS — E7800 Pure hypercholesterolemia, unspecified: Secondary | ICD-10-CM | POA: Insufficient documentation

## 2022-01-09 DIAGNOSIS — M810 Age-related osteoporosis without current pathological fracture: Secondary | ICD-10-CM | POA: Insufficient documentation

## 2022-01-09 NOTE — H&P (Signed)
REFERRING PHYSICIAN:  Ree Shay, MD   PROVIDER:  Monico Blitz, MD   MRN: [id number] DOB: 12-May-1951 DATE OF ENCOUNTER: 01/09/2022   Subjective    Chief Complaint: New Patient (New patient anorectal lesion )       History of Present Illness: Alison Rojas is a 71 y.o. female who is seen today as an office consultation at the request of Dr. Sheppard Penton for evaluation of New Patient (New patient anorectal lesion ) .   71 year old female who presents to the office for evaluation of a distal rectal mass seen on recent colonoscopy.  Patient had a positive Cologuard test and underwent a colonoscopy in early December 2022.  1 other polyp was noted at the ileocecal valve.  Pathology showed a tubular adenoma with underlying submucosal lipoma.  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  There was no high-grade dysplasia.  MRI of the rectum was performed and this showed what appeared to be a T1 mass with abutment to the internal anal sphincter.     Review of Systems: A complete review of systems was obtained from the patient.  I have reviewed this information and discussed as appropriate with the patient.  See HPI as well for other ROS.     Medical History: Past Medical History      Past Medical History:  Diagnosis Date   Anemia     Anxiety             Patient Active Problem List  Diagnosis   Diabetes mellitus (CMS-HCC)   Essential hypertension   Fuchs' corneal dystrophy of both eyes   Hypercholesterolemia   Neuralgia, post-herpetic   Osteoporosis   Plantar fascial fibromatosis   Raynaud's disease   Seizure (CMS-HCC)      Past Surgical History  History reviewed. No pertinent surgical history.      Allergies      Allergies  Allergen Reactions   Codeine Shortness Of Breath and Unknown              Current Outpatient Medications on File Prior to Visit  Medication Sig Dispense Refill   calcium citrate-vitamin D3 (CITRACAL+D PETITES)  200 mg-6.25 mcg (250 unit) tablet Take by mouth       traMADoL (ULTRAM) 50 mg tablet          No current facility-administered medications on file prior to visit.      Family History  History reviewed. No pertinent family history.      Social History        Tobacco Use  Smoking Status Every Day   Types: Cigarettes  Smokeless Tobacco Not on file      Social History  Social History         Socioeconomic History   Marital status: Single  Tobacco Use   Smoking status: Every Day      Types: Cigarettes  Substance and Sexual Activity   Alcohol use: Yes      Comment: 1 to 2 a week   Drug use: Never        Objective:         Vitals:    01/09/22 1022  BP: 122/66  Pulse: 80  Temp: 36.8 C (98.2 F)  SpO2: 98%  Weight: 67.5 kg (148 lb 12.8 oz)  Height: 170.2 cm (5\' 7" )      Exam Gen: NAD Abd: soft CV: RRR Lungs: CTA Rectal: distal rectal polyp R lateral dentate line, ~1.5cm  Labs, Imaging and Diagnostic Testing: Colonoscopy and path reports reviewed   Assessment and Plan:  Diagnoses and all orders for this visit:   Adenomatous rectal polyp     Patient with a right lateral distal rectal polyp, biopsy showed tubulovillous adenoma.  I have recommended transanal excision.  We discussed risk of bleeding, recurrence and postoperative pain.  We discussed typical healing time and recovery.  We discussed the need for additional follow-up after the procedure.  All questions were answered.  Patient would like to get this scheduled in a timely manner so that she can have eye surgery in early February.

## 2022-01-09 NOTE — H&P (View-Only) (Signed)
REFERRING PHYSICIAN:  Ree Shay, MD   PROVIDER:  Monico Blitz, MD   MRN: [id number] DOB: [birth year] DATE OF ENCOUNTER: 01/09/2022   Subjective    Chief Complaint: New Patient (New patient anorectal lesion )       History of Present Illness: Alison Rojas is a 71 y.o. female who is seen today as an office consultation at the request of Dr. Sheppard Penton for evaluation of New Patient (New patient anorectal lesion ) .   71 year old female who presents to the office for evaluation of a distal rectal mass seen on recent colonoscopy.  Patient had a positive Cologuard test and underwent a colonoscopy in early December 2022.  1 other polyp was noted at the ileocecal valve.  Pathology showed a tubular adenoma with underlying submucosal lipoma.  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  There was no high-grade dysplasia.  MRI of the rectum was performed and this showed what appeared to be a T1 mass with abutment to the internal anal sphincter.     Review of Systems: A complete review of systems was obtained from the patient.  I have reviewed this information and discussed as appropriate with the patient.  See HPI as well for other ROS.     Medical History: Past Medical History      Past Medical History:  Diagnosis Date   Anemia     Anxiety             Patient Active Problem List  Diagnosis   Diabetes mellitus (CMS-HCC)   Essential hypertension   Fuchs' corneal dystrophy of both eyes   Hypercholesterolemia   Neuralgia, post-herpetic   Osteoporosis   Plantar fascial fibromatosis   Raynaud's disease   Seizure (CMS-HCC)      Past Surgical History  History reviewed. No pertinent surgical history.      Allergies      Allergies  Allergen Reactions   Codeine Shortness Of Breath and Unknown              Current Outpatient Medications on File Prior to Visit  Medication Sig Dispense Refill   calcium citrate-vitamin D3 (CITRACAL+D PETITES)  200 mg-6.25 mcg (250 unit) tablet Take by mouth       traMADoL (ULTRAM) 50 mg tablet          No current facility-administered medications on file prior to visit.      Family History  History reviewed. No pertinent family history.      Social History        Tobacco Use  Smoking Status Every Day   Types: Cigarettes  Smokeless Tobacco Not on file      Social History  Social History         Socioeconomic History   Marital status: Single  Tobacco Use   Smoking status: Every Day      Types: Cigarettes  Substance and Sexual Activity   Alcohol use: Yes      Comment: 1 to 2 a week   Drug use: Never        Objective:         Vitals:    01/09/22 1022  BP: 122/66  Pulse: 80  Temp: 36.8 C (98.2 F)  SpO2: 98%  Weight: 67.5 kg (148 lb 12.8 oz)  Height: 170.2 cm (5\' 7" )      Exam Gen: NAD Abd: soft CV: RRR Lungs: CTA Rectal: distal rectal polyp R lateral dentate line, ~1.5cm  Labs, Imaging and Diagnostic Testing: Colonoscopy and path reports reviewed   Assessment and Plan:  Diagnoses and all orders for this visit:   Adenomatous rectal polyp     Patient with a right lateral distal rectal polyp, biopsy showed tubulovillous adenoma.  I have recommended transanal excision.  We discussed risk of bleeding, recurrence and postoperative pain.  We discussed typical healing time and recovery.  We discussed the need for additional follow-up after the procedure.  All questions were answered.  Patient would like to get this scheduled in a timely manner so that she can have eye surgery in early February.

## 2022-01-16 ENCOUNTER — Encounter (HOSPITAL_BASED_OUTPATIENT_CLINIC_OR_DEPARTMENT_OTHER): Payer: Self-pay | Admitting: General Surgery

## 2022-01-16 ENCOUNTER — Other Ambulatory Visit: Payer: Self-pay

## 2022-01-16 NOTE — Progress Notes (Signed)
Spoke w/ via phone for pre-op interview---pt Lab needs dos---- none              Lab results------none COVID test -----patient states asymptomatic no test needed Arrive at -------915 am 01-20-2022 NPO after MN NO Solid Food.  Clear liquids from MN until---815 am Med rec completed Medications to take morning of surgery -----eye drops Diabetic medication -----n/a Patient instructed no nail polish to be worn day of surgery Patient instructed to bring photo id and insurance card day of surgery Patient aware to have Driver (ride ) / caregiver    for 24 hours after surgery driver home/caregiver friend Alison Rojas pt to bring number day of surgery Patient Special Instructions -----none Pre-Op special Istructions -----none Patient verbalized understanding of instructions that were given at this phone interview. Patient denies shortness of breath, chest pain, fever, cough at this phone interview.

## 2022-01-20 ENCOUNTER — Encounter (HOSPITAL_BASED_OUTPATIENT_CLINIC_OR_DEPARTMENT_OTHER)
Admission: RE | Disposition: A | Discharge status: Home / Self Care | Payer: Self-pay | Source: Home / Self Care | Attending: General Surgery

## 2022-01-20 ENCOUNTER — Ambulatory Visit (HOSPITAL_BASED_OUTPATIENT_CLINIC_OR_DEPARTMENT_OTHER): Payer: Medicare Other | Admitting: Anesthesiology

## 2022-01-20 ENCOUNTER — Encounter (HOSPITAL_BASED_OUTPATIENT_CLINIC_OR_DEPARTMENT_OTHER): Payer: Self-pay | Admitting: General Surgery

## 2022-01-20 ENCOUNTER — Ambulatory Visit (HOSPITAL_BASED_OUTPATIENT_CLINIC_OR_DEPARTMENT_OTHER)
Admission: RE | Admit: 2022-01-20 | Discharge: 2022-01-20 | Disposition: A | Discharge status: Home / Self Care | Payer: Medicare Other | Attending: General Surgery | Admitting: General Surgery

## 2022-01-20 DIAGNOSIS — Z87891 Personal history of nicotine dependence: Secondary | ICD-10-CM | POA: Diagnosis not present

## 2022-01-20 DIAGNOSIS — F419 Anxiety disorder, unspecified: Secondary | ICD-10-CM | POA: Insufficient documentation

## 2022-01-20 DIAGNOSIS — D127 Benign neoplasm of rectosigmoid junction: Secondary | ICD-10-CM | POA: Diagnosis not present

## 2022-01-20 DIAGNOSIS — K629 Disease of anus and rectum, unspecified: Secondary | ICD-10-CM | POA: Diagnosis present

## 2022-01-20 HISTORY — DX: Lesion of plantar nerve, bilateral lower limbs: G57.63

## 2022-01-20 HISTORY — DX: Raynaud's syndrome without gangrene: I73.00

## 2022-01-20 HISTORY — DX: Endothelial corneal dystrophy, bilateral: H18.513

## 2022-01-20 HISTORY — PX: TRANSANAL EXCISION OF RECTAL MASS: SHX6134

## 2022-01-20 HISTORY — DX: Disease of anus and rectum, unspecified: K62.9

## 2022-01-20 HISTORY — DX: Prediabetes: R73.03

## 2022-01-20 HISTORY — DX: Personal history of diseases of the blood and blood-forming organs and certain disorders involving the immune mechanism: Z86.2

## 2022-01-20 SURGERY — EXCISION, MASS, RECTUM, ANAL APPROACH
Anesthesia: Monitor Anesthesia Care | Site: Rectum

## 2022-01-20 MED ORDER — LIDOCAINE 5 % EX OINT: 1.0000 "application " | TOPICAL_OINTMENT | CUTANEOUS | 0 refills | Status: DC | PRN

## 2022-01-20 MED ORDER — NAPROXEN 500 MG PO TABS: 500.0000 mg | ORAL_TABLET | Freq: Two times a day (BID) | ORAL | 2 refills | Status: DC

## 2022-01-20 MED ORDER — OXYCODONE HCL 5 MG PO TABS: 5.0000 mg | ORAL_TABLET | ORAL | Status: DC | PRN

## 2022-01-20 MED ORDER — LIDOCAINE HCL (CARDIAC) PF 100 MG/5ML IV SOSY
PREFILLED_SYRINGE | INTRAVENOUS | Status: DC | PRN
  Administered 2022-01-20: 40 mg via INTRAVENOUS

## 2022-01-20 MED ORDER — SODIUM CHLORIDE 0.9 % IV SOLN: 250.0000 mL | INTRAVENOUS | Status: DC | PRN

## 2022-01-20 MED ORDER — 0.9 % SODIUM CHLORIDE (POUR BTL) OPTIME
TOPICAL | Status: DC | PRN
  Administered 2022-01-20: 500 mL

## 2022-01-20 MED ORDER — ONDANSETRON HCL 4 MG/2ML IJ SOLN
INTRAMUSCULAR | Status: DC | PRN
Start: 2022-01-20 — End: 2022-01-20
  Administered 2022-01-20: 4 mg via INTRAVENOUS

## 2022-01-20 MED ORDER — GLYCOPYRROLATE 0.2 MG/ML IJ SOLN
INTRAMUSCULAR | Status: DC | PRN
  Administered 2022-01-20: .1 mg via INTRAVENOUS

## 2022-01-20 MED ORDER — BUPIVACAINE LIPOSOME 1.3 % IJ SUSP: 20.0000 mL | Freq: Once | INTRAMUSCULAR | Status: DC

## 2022-01-20 MED ORDER — ACETAMINOPHEN 10 MG/ML IV SOLN: 1000.0000 mg | Freq: Once | INTRAVENOUS | Status: DC | PRN

## 2022-01-20 MED ORDER — ACETAMINOPHEN 325 MG PO TABS: 650.0000 mg | ORAL_TABLET | ORAL | Status: DC | PRN

## 2022-01-20 MED ORDER — MIDAZOLAM HCL 2 MG/2ML IJ SOLN
INTRAMUSCULAR | Status: DC | PRN
  Administered 2022-01-20 (×2): 1 mg via INTRAVENOUS

## 2022-01-20 MED ORDER — PROPOFOL 500 MG/50ML IV EMUL
INTRAVENOUS | Status: DC | PRN
  Administered 2022-01-20: 100 ug/kg/min via INTRAVENOUS

## 2022-01-20 MED ORDER — SODIUM CHLORIDE 0.9% FLUSH: 3.0000 mL | INTRAVENOUS | Status: DC | PRN

## 2022-01-20 MED ORDER — BUPIVACAINE-EPINEPHRINE 0.5% -1:200000 IJ SOLN
INTRAMUSCULAR | Status: DC | PRN
  Administered 2022-01-20: 30 mL

## 2022-01-20 MED ORDER — BUPIVACAINE LIPOSOME 1.3 % IJ SUSP
INTRAMUSCULAR | Status: DC | PRN
  Administered 2022-01-20: 20 mL

## 2022-01-20 MED ORDER — SODIUM CHLORIDE 0.9% FLUSH: 3.0000 mL | Freq: Two times a day (BID) | INTRAVENOUS | Status: DC

## 2022-01-20 MED ORDER — LIDOCAINE 5 % EX OINT
TOPICAL_OINTMENT | CUTANEOUS | Status: DC | PRN
  Administered 2022-01-20: 1

## 2022-01-20 MED ORDER — PROPOFOL 10 MG/ML IV BOLUS
INTRAVENOUS | Status: DC | PRN
  Administered 2022-01-20: 20 mg via INTRAVENOUS

## 2022-01-20 MED ORDER — ACETAMINOPHEN 325 MG RE SUPP: 650.0000 mg | RECTAL | Status: DC | PRN

## 2022-01-20 MED ORDER — LACTATED RINGERS IV SOLN: INTRAVENOUS | Status: DC

## 2022-01-20 MED ORDER — FENTANYL CITRATE (PF) 100 MCG/2ML IJ SOLN: 25.0000 ug | INTRAMUSCULAR | Status: DC | PRN

## 2022-01-20 SURGICAL SUPPLY — 47 items
APL SKNCLS STERI-STRIP NONHPOA (GAUZE/BANDAGES/DRESSINGS) ×1
BENZOIN TINCTURE PRP APPL 2/3 (GAUZE/BANDAGES/DRESSINGS) ×2 IMPLANT
BLADE EXTENDED COATED 6.5IN (ELECTRODE) ×1 IMPLANT
BLADE SURG 10 STRL SS (BLADE) ×2 IMPLANT
COVER BACK TABLE 60X90IN (DRAPES) ×2 IMPLANT
COVER MAYO STAND STRL (DRAPES) ×2 IMPLANT
DECANTER SPIKE VIAL GLASS SM (MISCELLANEOUS) ×2 IMPLANT
DRAPE HYSTEROSCOPY (MISCELLANEOUS) IMPLANT
DRAPE LAPAROTOMY 100X72 PEDS (DRAPES) ×2 IMPLANT
DRAPE SHEET LG 3/4 BI-LAMINATE (DRAPES) IMPLANT
DRAPE UTILITY XL STRL (DRAPES) ×2 IMPLANT
DRSG PAD ABDOMINAL 8X10 ST (GAUZE/BANDAGES/DRESSINGS) ×2 IMPLANT
ELECT REM PT RETURN 9FT ADLT (ELECTROSURGICAL) ×2
ELECTRODE REM PT RTRN 9FT ADLT (ELECTROSURGICAL) ×1 IMPLANT
GAUZE 4X4 16PLY ~~LOC~~+RFID DBL (SPONGE) ×2 IMPLANT
GAUZE SPONGE 4X4 12PLY STRL (GAUZE/BANDAGES/DRESSINGS) ×2 IMPLANT
GLOVE SURG ENC MOIS LTX SZ6.5 (GLOVE) ×2 IMPLANT
GLOVE SURG UNDER LTX SZ6.5 (GLOVE) ×2 IMPLANT
GLOVE SURG UNDER POLY LF SZ6.5 (GLOVE) ×2 IMPLANT
GLOVE SURG UNDER POLY LF SZ7.5 (GLOVE) ×1 IMPLANT
GOWN STRL REUS W/TWL XL LVL3 (GOWN DISPOSABLE) ×3 IMPLANT
KIT SIGMOIDOSCOPE (SET/KITS/TRAYS/PACK) IMPLANT
KIT TURNOVER CYSTO (KITS) ×2 IMPLANT
LEGGING LITHOTOMY PAIR STRL (DRAPES) IMPLANT
NDL SAFETY ECLIPSE 18X1.5 (NEEDLE) IMPLANT
NEEDLE HYPO 18GX1.5 SHARP (NEEDLE)
NEEDLE HYPO 22GX1.5 SAFETY (NEEDLE) ×2 IMPLANT
NS IRRIG 500ML POUR BTL (IV SOLUTION) ×2 IMPLANT
PACK BASIN DAY SURGERY FS (CUSTOM PROCEDURE TRAY) ×2 IMPLANT
PAD ARMBOARD 7.5X6 YLW CONV (MISCELLANEOUS) IMPLANT
PANTS MESH DISP LRG (UNDERPADS AND DIAPERS) ×1 IMPLANT
PANTS MESH DISPOSABLE L (UNDERPADS AND DIAPERS) ×1
PENCIL SMOKE EVACUATOR (MISCELLANEOUS) ×2 IMPLANT
SPONGE SURGIFOAM ABS GEL 12-7 (HEMOSTASIS) IMPLANT
SUCTION FRAZIER HANDLE 10FR (MISCELLANEOUS) ×1
SUCTION TUBE FRAZIER 10FR DISP (MISCELLANEOUS) IMPLANT
SUT CHROMIC 2 0 SH (SUTURE) ×2 IMPLANT
SUT CHROMIC 3 0 SH 27 (SUTURE) IMPLANT
SUT VIC AB 2-0 SH 27 (SUTURE) ×2
SUT VIC AB 2-0 SH 27XBRD (SUTURE) IMPLANT
SUT VIC AB 3-0 SH 27 (SUTURE) ×2
SUT VIC AB 3-0 SH 27X BRD (SUTURE) IMPLANT
SYR CONTROL 10ML LL (SYRINGE) ×2 IMPLANT
TOWEL OR 17X26 10 PK STRL BLUE (TOWEL DISPOSABLE) ×2 IMPLANT
TRAY DSU PREP LF (CUSTOM PROCEDURE TRAY) ×2 IMPLANT
TUBE CONNECTING 12X1/4 (SUCTIONS) ×2 IMPLANT
YANKAUER SUCT BULB TIP NO VENT (SUCTIONS) ×2 IMPLANT

## 2022-01-20 NOTE — Op Note (Signed)
01/20/2022  11:04 AM  PATIENT:  Alison Rojas  71 y.o. female  Patient Care Team: Hayden Rasmussen, MD as PCP - General (Family Medicine)  PRE-OPERATIVE DIAGNOSIS:  DISTAL RECTAL POLYP  POST-OPERATIVE DIAGNOSIS:  DISTAL RECTAL POLYP  PROCEDURE:  TRANSANAL EXCISION OF DISTAL  RECTAL POLYP   Surgeon(s): Leighton Ruff, MD  ASSISTANT: none   ANESTHESIA:   local and MAC  SPECIMEN:  Source of Specimen:  distal rectal polyp (R ant)  DISPOSITION OF SPECIMEN:  PATHOLOGY  COUNTS:  YES  PLAN OF CARE: Discharge to home after PACU  PATIENT DISPOSITION:  PACU - hemodynamically stable.  INDICATION: 71 y.o. F with distal rectal polyp seen on colonoscopy   OR FINDINGS: R anterior distal rectal polyp involving the dentate line  DESCRIPTION: the patient was identified in the preoperative holding area and taken to the OR where they were laid on the operating room table.  MAC anesthesia was induced without difficulty. The patient was then positioned in prone jackknife position with buttocks gently taped apart.  The patient was then prepped and draped in usual sterile fashion.  SCDs were noted to be in place prior to the initiation of anesthesia. A surgical timeout was performed indicating the correct patient, procedure, positioning and need for preoperative antibiotics.  A rectal block was performed using Marcaine with epinephrine mixed with Experel.    I began with a digital rectal exam.  The mass was palpated in the right anterior distal rectum and anal canal.  I then placed a Hill-Ferguson anoscope into the anal canal and evaluated this completely.  I evaluated the entire border of the polyp.  I then placed a Parks anal retractor into the anal canal and gently dilated this.  1 to 2 mm margins were marked using electrocautery.  I then injected Marcaine and epinephrine into the submucosal plane.  I then dissected into the anoderm using electrocautery.  I identified the sphincter complex underneath and  preserve this.  I carefully remove the anoderm layer down into the distal rectum.  I continued to divide in the submucosal plane until the entire polyp was freed.  I divided the edges through my previous marked sites using electrocautery.  Hemostasis was achieved as we went.  The polyp was then removed and oriented on a cork board for pathologic evaluation.  I marked the distal border as well as the anterior midline and right lateral borders.  The rectal mucosa was reapproximated using a running 3-0 Vicryl suture.  The anoderm was reapproximated using interrupted 2-0 chromic sutures.  Additional hemostasis was achieved using figure-of-eight 2-0 chromic sutures.  Once hemostasis was well controlled, additional Marcaine was injected around the areas of incision.  The dentate line was reapproximated with no rectal mucosa distal to this line.  Lidocaine ointment and sterile dressing were applied.  The patient was then awakened from anesthesia and sent to the postanesthesia care unit in stable condition.  All counts were correct per operating room staff.

## 2022-01-20 NOTE — Anesthesia Postprocedure Evaluation (Signed)
Anesthesia Post Note  Patient: Alison Rojas  Procedure(s) Performed: EXCISION OF DISTAL  RECTAL POLYP (Rectum)     Patient location during evaluation: PACU Anesthesia Type: MAC Level of consciousness: awake and alert Pain management: pain level controlled Vital Signs Assessment: post-procedure vital signs reviewed and stable Respiratory status: spontaneous breathing, nonlabored ventilation, respiratory function stable and patient connected to nasal cannula oxygen Cardiovascular status: stable and blood pressure returned to baseline Postop Assessment: no apparent nausea or vomiting Anesthetic complications: no   No notable events documented.  Last Vitals:  Vitals:   01/20/22 1130 01/20/22 1145  BP: 100/66 128/79  Pulse: (!) 55 (!) 53  Resp: 13 16  Temp: (!) 36.3 C (!) 36.1 C  SpO2: 100% 100%    Last Pain:  Vitals:   01/20/22 1145  TempSrc:   PainSc: 0-No pain                 Belenda Cruise P Kamill Fulbright

## 2022-01-20 NOTE — Anesthesia Preprocedure Evaluation (Signed)
Anesthesia Evaluation    Airway Mallampati: II  TM Distance: >3 FB Neck ROM: Full    Dental no notable dental hx.    Pulmonary former smoker,    Pulmonary exam normal        Cardiovascular  Rhythm:Regular Rate:Normal     Neuro/Psych Seizures - (single episode 2006), Well Controlled,  Anxiety    GI/Hepatic Neg liver ROS, Rectal polyp   Endo/Other  negative endocrine ROS  Renal/GU negative Renal ROS  negative genitourinary   Musculoskeletal  (+) Arthritis , Osteoarthritis,    Abdominal Normal abdominal exam  (+)   Peds  Hematology negative hematology ROS (+)   Anesthesia Other Findings   Reproductive/Obstetrics                             Anesthesia Physical Anesthesia Plan  ASA: 2  Anesthesia Plan: MAC   Post-op Pain Management:    Induction: Intravenous  PONV Risk Score and Plan: 2 and Ondansetron, Dexamethasone, Propofol infusion and Treatment may vary due to age or medical condition  Airway Management Planned: Simple Face Mask, Natural Airway and Nasal Cannula  Additional Equipment: None  Intra-op Plan:   Post-operative Plan:   Informed Consent: I have reviewed the patients History and Physical, chart, labs and discussed the procedure including the risks, benefits and alternatives for the proposed anesthesia with the patient or authorized representative who has indicated his/her understanding and acceptance.     Dental advisory given  Plan Discussed with: CRNA  Anesthesia Plan Comments:         Anesthesia Quick Evaluation

## 2022-01-20 NOTE — Interval H&P Note (Signed)
History and Physical Interval Note:  01/20/2022 10:05 AM  Alison Rojas  has presented today for surgery, with the diagnosis of RECTAL POLYP.  The various methods of treatment have been discussed with the patient and family. After consideration of risks, benefits and other options for treatment, the patient has consented to  Procedure(s): TRANSANAL EXCISION OF RECTAL POLYP (N/A) as a surgical intervention.  The patient's history has been reviewed, patient examined, no change in status, stable for surgery.  I have reviewed the patient's chart and labs.  Questions were answered to the patient's satisfaction.     Rosario Adie, MD  [facility]

## 2022-01-20 NOTE — Transfer of Care (Signed)
Immediate Anesthesia Transfer of Care Note  Patient: Alison Rojas  Procedure(s) Performed: EXCISION OF DISTAL  RECTAL POLYP (Rectum)  Patient Location: PACU  Anesthesia Type:MAC  Level of Consciousness: awake, alert , oriented and patient cooperative  Airway & Oxygen Therapy: Patient Spontanous Breathing  Post-op Assessment: Report given to RN and Post -op Vital signs reviewed and stable  Post vital signs: Reviewed and stable  Last Vitals:  Vitals Value Taken Time  BP    Temp    Pulse    Resp    SpO2      Last Pain:  Vitals:   01/20/22 0945  TempSrc: Oral  PainSc: 0-No pain      Patients Stated Pain Goal: 4 ([id number])  Complications: No notable events documented.

## 2022-01-20 NOTE — Discharge Instructions (Addendum)
Beginning the day after surgery:  You may sit in a tub of warm water 2-3 times a day to relieve discomfort.  Eat a regular diet high in fiber.  Avoid foods that give you constipation or diarrhea.  Avoid foods that are difficult to digest, such as seeds, nuts, corn or popcorn.  Do not go any longer than 2 days without a bowel movement.  You may take a dose of Milk of Magnesia if you become constipated.    Drink 6-8 glasses of water daily.  Walking is encouraged.  Avoid strenuous activity and heavy lifting for one month after surgery.    Call the office if you have any questions or concerns.  Call immediately if you develop:  Excessive rectal bleeding (more than a cup or passing large clots) Increased discomfort Fever greater than 100 F Difficulty urinating       Post Anesthesia Home Care Instructions  Activity: Get plenty of rest for the remainder of the day. A responsible individual must stay with you for 24 hours following the procedure.  For the next 24 hours, DO NOT: -Drive a car -Operate machinery -Drink alcoholic beverages -Take any medication unless instructed by your physician -Make any legal decisions or sign important papers.  Meals: Start with liquid foods such as gelatin or soup. Progress to regular foods as tolerated. Avoid greasy, spicy, heavy foods. If nausea and/or vomiting occur, drink only clear liquids until the nausea and/or vomiting subsides. Call your physician if vomiting continues.  Special Instructions/Symptoms: Your throat may feel dry or sore from the anesthesia or the breathing tube placed in your throat during surgery. If this causes discomfort, gargle with warm salt water. The discomfort should disappear within 24 hours.         Information for Discharge Teaching: EXPAREL (bupivacaine liposome injectable suspension)   Your surgeon or anesthesiologist gave you EXPAREL(bupivacaine) to help control your pain after surgery.  EXPAREL is a local  anesthetic that provides pain relief by numbing the tissue around the surgical site. EXPAREL is designed to release pain medication over time and can control pain for up to 72 hours. Depending on how you respond to EXPAREL, you may require less pain medication during your recovery.  Possible side effects: Temporary loss of sensation or ability to move in the area where bupivacaine was injected. Nausea, vomiting, constipation Rarely, numbness and tingling in your mouth or lips, lightheadedness, or anxiety may occur. Call your doctor right away if you think you may be experiencing any of these sensations, or if you have other questions regarding possible side effects.  Follow all other discharge instructions given to you by your surgeon or nurse. Eat a healthy diet and drink plenty of water or other fluids.  If you return to the hospital for any reason within 96 hours following the administration of EXPAREL, it is important for health care providers to know that you have received this anesthetic. A teal colored band has been placed on your arm with the date, time and amount of EXPAREL you have received in order to alert and inform your health care providers. Please leave this armband in place for the full 96 hours following administration, and then you may remove the band.  

## 2022-01-23 ENCOUNTER — Encounter (HOSPITAL_BASED_OUTPATIENT_CLINIC_OR_DEPARTMENT_OTHER): Payer: Self-pay | Admitting: General Surgery

## 2022-01-23 LAB — SURGICAL PATHOLOGY

## 2022-09-29 ENCOUNTER — Other Ambulatory Visit (HOSPITAL_COMMUNITY): Payer: Self-pay | Admitting: Nurse Practitioner

## 2022-09-29 DIAGNOSIS — I739 Peripheral vascular disease, unspecified: Secondary | ICD-10-CM

## 2022-10-03 ENCOUNTER — Ambulatory Visit (HOSPITAL_COMMUNITY)
Admission: RE | Admit: 2022-10-03 | Discharge: 2022-10-03 | Disposition: A | Discharge status: Home / Self Care | Payer: Medicare Other | Source: Ambulatory Visit | Attending: Nurse Practitioner | Admitting: Nurse Practitioner

## 2022-10-03 DIAGNOSIS — I739 Peripheral vascular disease, unspecified: Secondary | ICD-10-CM | POA: Insufficient documentation

## 2022-10-12 ENCOUNTER — Ambulatory Visit: Payer: Medicare Other | Admitting: Podiatry

## 2022-10-27 ENCOUNTER — Other Ambulatory Visit: Payer: Self-pay | Admitting: Nurse Practitioner

## 2022-10-27 DIAGNOSIS — Z780 Asymptomatic menopausal state: Secondary | ICD-10-CM

## 2022-10-31 ENCOUNTER — Ambulatory Visit: Payer: Medicare Other | Admitting: Podiatry

## 2022-10-31 ENCOUNTER — Ambulatory Visit (INDEPENDENT_AMBULATORY_CARE_PROVIDER_SITE_OTHER): Payer: Medicare Other

## 2022-10-31 ENCOUNTER — Encounter: Payer: Self-pay | Admitting: Podiatry

## 2022-10-31 DIAGNOSIS — M216X2 Other acquired deformities of left foot: Secondary | ICD-10-CM

## 2022-10-31 DIAGNOSIS — M810 Age-related osteoporosis without current pathological fracture: Secondary | ICD-10-CM

## 2022-10-31 DIAGNOSIS — M2041 Other hammer toe(s) (acquired), right foot: Secondary | ICD-10-CM | POA: Diagnosis not present

## 2022-10-31 DIAGNOSIS — M2042 Other hammer toe(s) (acquired), left foot: Secondary | ICD-10-CM

## 2022-11-01 ENCOUNTER — Other Ambulatory Visit: Payer: Self-pay | Admitting: Nurse Practitioner

## 2022-11-01 DIAGNOSIS — Z13820 Encounter for screening for osteoporosis: Secondary | ICD-10-CM

## 2022-11-01 NOTE — Progress Notes (Signed)
  Subjective:  Patient ID: Alison Rojas, female    DOB: 25-Nov-1951,  MRN: [id number]  Chief Complaint  Patient presents with   Hammer Toe    np bil hammertoes ( second opinion)    71 y.o. female presents with the above complaint. History confirmed with patient.  She has had these for many years they are getting worse and rubbing on shoes and causing discomfort.  She has considered surgery before but due to the time and cost and recovery process she was concerned about doing this.  Both feet are painful  Objective:  Physical Exam: warm, good capillary refill, no trophic changes or ulcerative lesions, normal DP and PT pulses, normal sensory exam, and lesser hammertoe deformities with a pes cavus foot type, she has prominence of the metatarsal heads plantar, erythema over the PIPJ's dorsally, there is semirigid deformity of the second toe, semireducible deformity of 3 and 4 and 5 with adductovarus   Radiographs: Multiple views x-ray of both feet: Lesser hammertoe deformities 234 and 5 noted, no significant hallux valgus deformity she does have a pes cavus foot type Assessment:   1. Acquired hammertoes of both feet   2. Plantar flexed metatarsal bone of left foot   3. Age-related osteoporosis without current pathological fracture      Plan:  Patient was evaluated and treated and all questions answered.   Discussed the etiology and treatment including surgical and non surgical treatment for painful  hammertoes.  She has exhausted all non surgical treatment prior to this visit including shoe gear changes and padding.  She desires surgical intervention. We discussed all risks including but not limited to: pain, swelling, infection, scar, numbness which may be temporary or permanent, chronic pain, stiffness, nerve pain or damage, wound healing problems, bone healing problems including delayed or non-union and recurrence. Specifically we discussed the following procedures: Left foot Weil osteotomy of  the second metatarsal, PIPJ arthrodesis with K wire fixation of the second third and fourth toes and derotational arthroplasty of the fifth toe.  We reviewed her x-rays together and discussed the rationale for each of these procedures.  I did recommend checking her vitamin D level as well.  We discussed the postoperative course and period of restricted weightbearing in the cam boot.  We also discussed that the Kirschner wires would not be removed until week 6.  Informed consent was signed today. Surgery will be scheduled at a mutually agreeable date. Information regarding this will be forwarded to our surgery scheduler.   Surgical plan:  Procedure: -Left foot Weil osteotomy of the second metatarsal, PIPJ arthrodesis with K wire fixation of the second third and fourth toes and derotational arthroplasty of the fifth toe  Location: -[facility]  Anesthesia plan: -IV sedation with regional block  Postoperative pain plan: - Tylenol 1000 mg every 6 hours, ibuprofen 600 mg every 6 hours, gabapentin 300 mg every 8 hours x5 days, oxycodone 5 mg 1-2 tabs every 6 hours only as needed  DVT prophylaxis: -None required  WB Restrictions / DME needs: -WBAT in CAM boot postop  No follow-ups on file.

## 2022-11-13 ENCOUNTER — Other Ambulatory Visit: Payer: Self-pay | Admitting: Podiatry

## 2022-11-13 ENCOUNTER — Other Ambulatory Visit: Payer: Self-pay | Admitting: Nurse Practitioner

## 2022-11-13 DIAGNOSIS — M2041 Other hammer toe(s) (acquired), right foot: Secondary | ICD-10-CM

## 2022-11-13 DIAGNOSIS — M216X2 Other acquired deformities of left foot: Secondary | ICD-10-CM

## 2022-11-13 DIAGNOSIS — R109 Unspecified abdominal pain: Secondary | ICD-10-CM

## 2022-11-13 DIAGNOSIS — M810 Age-related osteoporosis without current pathological fracture: Secondary | ICD-10-CM

## 2022-11-14 ENCOUNTER — Ambulatory Visit
Admission: RE | Admit: 2022-11-14 | Discharge: 2022-11-14 | Disposition: A | Discharge status: Home / Self Care | Payer: Medicare Other | Source: Ambulatory Visit | Attending: Nurse Practitioner | Admitting: Nurse Practitioner

## 2022-11-14 DIAGNOSIS — R109 Unspecified abdominal pain: Secondary | ICD-10-CM

## 2022-11-14 MED ORDER — IOPAMIDOL (ISOVUE-370) INJECTION 76%
80.0000 mL | Freq: Once | INTRAVENOUS | Status: AC | PRN
  Administered 2022-11-14: 80 mL via INTRAVENOUS

## 2022-11-17 ENCOUNTER — Other Ambulatory Visit: Payer: Self-pay | Admitting: Nurse Practitioner

## 2022-11-17 ENCOUNTER — Ambulatory Visit
Admission: RE | Admit: 2022-11-17 | Discharge: 2022-11-17 | Disposition: A | Discharge status: Home / Self Care | Payer: Medicare Other | Source: Ambulatory Visit | Attending: Nurse Practitioner | Admitting: Nurse Practitioner

## 2022-11-17 DIAGNOSIS — M5440 Lumbago with sciatica, unspecified side: Secondary | ICD-10-CM

## 2022-11-17 DIAGNOSIS — M542 Cervicalgia: Secondary | ICD-10-CM

## 2022-12-25 ENCOUNTER — Encounter: Payer: Self-pay | Admitting: Podiatry

## 2022-12-25 ENCOUNTER — Telehealth: Payer: Self-pay

## 2022-12-25 NOTE — Telephone Encounter (Signed)
Alison Rojas called to cancel her surgery with Dr. Sherryle Lis on 01/26/2023. She stated she wants to find an alternative to surgery. She scheduled an appointment with Dr, Sherryle Lis on 01/03/2023 to talk about it. Notified Dr. Sherryle Lis and Caren Griffins at [facility]

## 2023-01-03 ENCOUNTER — Ambulatory Visit: Payer: Medicare HMO | Admitting: Podiatry

## 2023-01-03 DIAGNOSIS — M2041 Other hammer toe(s) (acquired), right foot: Secondary | ICD-10-CM | POA: Diagnosis not present

## 2023-01-03 DIAGNOSIS — M7741 Metatarsalgia, right foot: Secondary | ICD-10-CM | POA: Diagnosis not present

## 2023-01-03 DIAGNOSIS — M7742 Metatarsalgia, left foot: Secondary | ICD-10-CM

## 2023-01-03 DIAGNOSIS — M2042 Other hammer toe(s) (acquired), left foot: Secondary | ICD-10-CM

## 2023-01-03 DIAGNOSIS — M216X2 Other acquired deformities of left foot: Secondary | ICD-10-CM

## 2023-01-03 NOTE — Patient Instructions (Signed)
Look for silicone metatarsal pad support sleeve on [employer] we discussed: Hoka and Altra. I have the [employer] 8 and the [employer]. You need a shoe with maximum support and cushioning

## 2023-01-03 NOTE — Progress Notes (Signed)
  Subjective:  Patient ID: Alison Rojas, female    DOB: [birth year],  MRN: [id number]  Chief Complaint  Patient presents with   Hammer Toe    WANTS TO TALK ABOUT AN ALTERNATIVE TO SURGERY    72 y.o. female presents with the above complaint. History confirmed with patient.  She returns for follow-up on her hammertoes and metatarsal pain, she was concerned about the healing process especially as relates to bone healing.  She has brought her orthoses that she has with her including a recently purchased Aetrex as well as a molded orthosis from a previous doctor.  The custom molded orthoses have been helpful previously but she can only get them to fit in her hiking shoes.  She also feels that this may be contributing to increasing knee pain  Objective:  Physical Exam: warm, good capillary refill, no trophic changes or ulcerative lesions, normal DP and PT pulses, normal sensory exam, and semireducible hammertoe deformities and prominent metatarsal area with hyperkeratosis and tenderness thinning of the fat pad noted, she has collapse of the medial arch on weightbearing.  Assessment:   1. Acquired hammertoes of both feet   2. Plantar flexed metatarsal bone of left foot   3. Metatarsalgia of both feet      Plan:  Patient was evaluated and treated and all questions answered.  We again discussed surgical and nonsurgical treatment.  I do think it is reasonable to continue to pursue further nonoperative treatments prior to considering surgery due to her osteoporosis.  We discussed shoe gear that I think would be beneficial for including Altra or Hoka shoes.  She will get these first and try her yellow orthoses that only fit in her hiking shoes currently because these have been helpful and have eliminated her metatarsal pain.  I also recommended offloading with a silicone metatarsal pad sleeve that she will get on [place].  If she does not have complete relief of this and we may consider a custom molded foot  orthoses.  We discussed that this would not be covered by insurance and would be an out-of-pocket cost but she may benefit from this.  I will see her back as needed and she will let me know how she is doing  Return if symptoms worsen or fail to improve.

## 2023-02-01 ENCOUNTER — Encounter: Payer: Medicare Other | Admitting: Podiatry

## 2023-02-15 ENCOUNTER — Other Ambulatory Visit: Payer: Medicare Other

## 2023-03-08 ENCOUNTER — Encounter: Payer: Medicare Other | Admitting: Podiatry

## 2023-06-18 IMAGING — MR MR PELVIS WO/W CM
8 of 9 series · 42 of 48 positions shown · IV contrast (7ml GADAVIST)
Comparison: Colonoscopy report of 11/24/2021

CLINICAL DATA: Colonoscopy demonstrating a distal rectal 1.5 cm
nonobstructive mass.

EXAM:
MRI PELVIS WITHOUT AND WITH CONTRAST
TECHNIQUE: Multiplanar multisequence MR imaging of the pelvis was performed
both before and after administration of intravenous contrast.
Ultrasound gel was administered per rectum to optimize tumor
evaluation.
CONTRAST:  7mL GADAVIST GADOBUTROL 1 MMOL/ML IV SOLN

[Series 3: T2 · sagittal · 3.0mm · 0.74mm/px · 5 of 40 slices shown (1 of 5)]
[im 1/40]
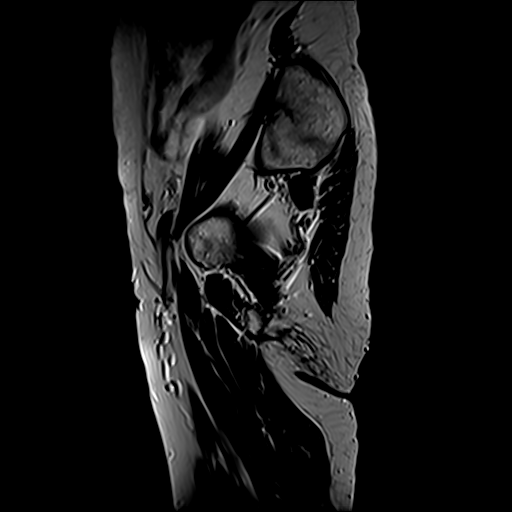
[im 10/40]
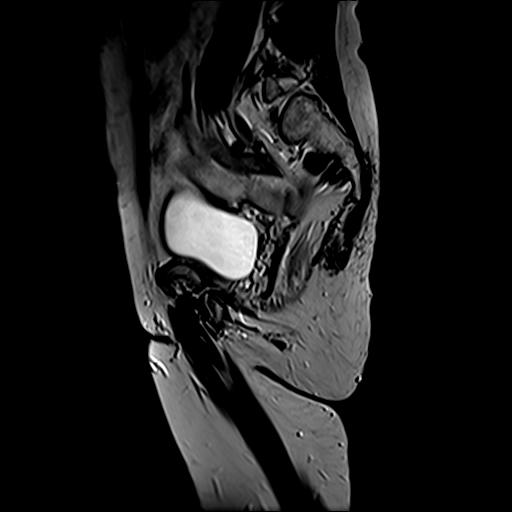
[im 20/40]
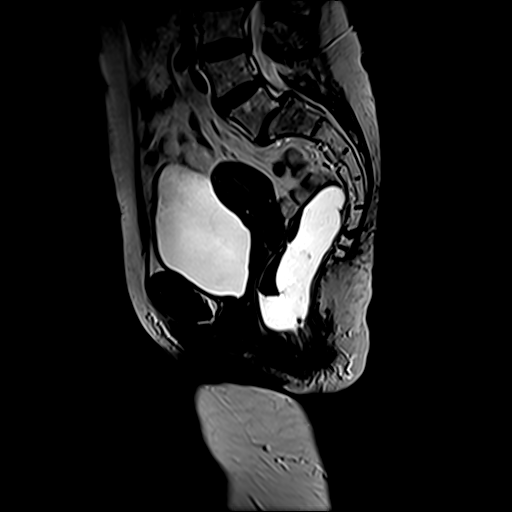
[im 30/40]
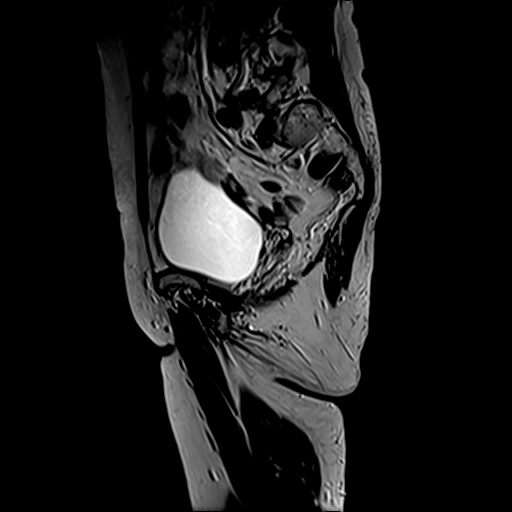
[im 40/40]
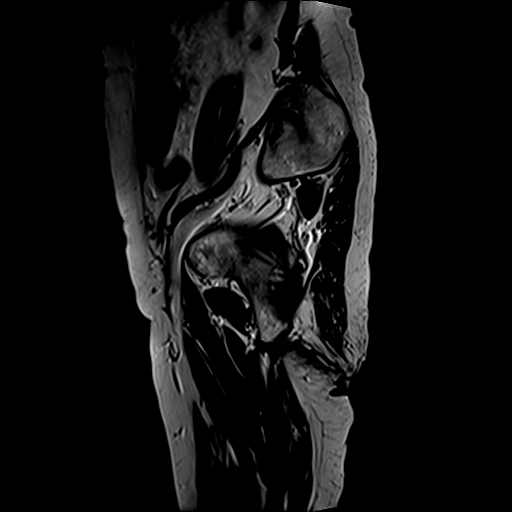

[Series 4: T2 · axial · 5.0mm · 0.99mm/px · z∈[-86,+112]mm · 4 of 34 slices shown (2 of 5)]
[im 1/34]
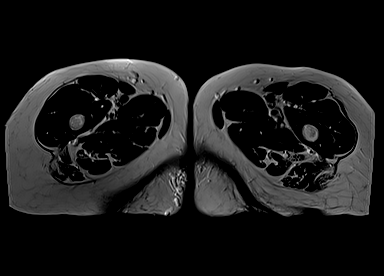
[im 12/34]
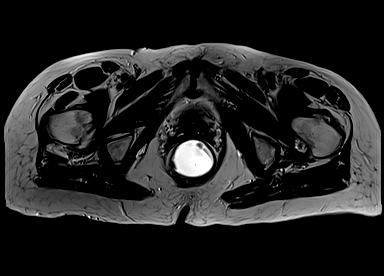
[im 23/34]
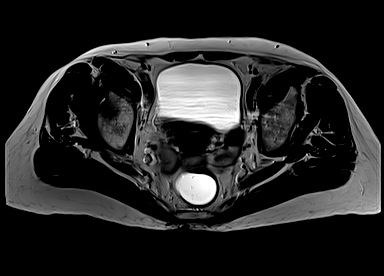
[im 34/34]
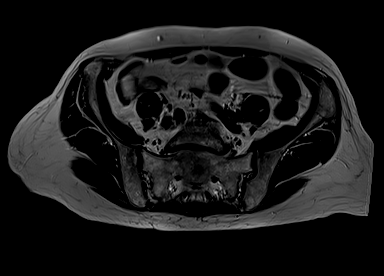

[Series 5: T2 · coronal · 3.5mm · 0.79mm/px · 5 of 47 slices shown (3 of 5)]
[im 1/47]
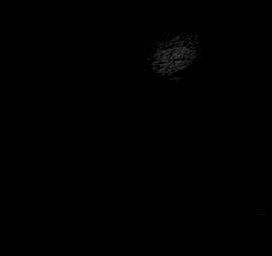
[im 12/47]
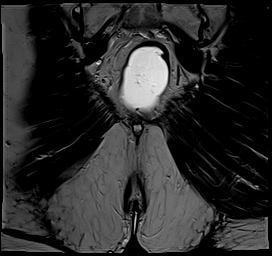
[im 24/47]
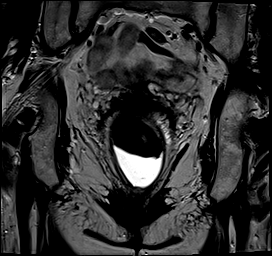
[im 35/47]
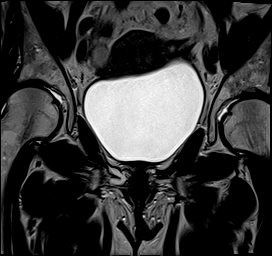
[im 47/47]
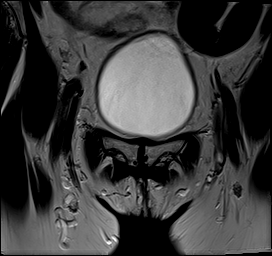

[Series 6: DWI · axial · 5.5mm · 1.48mm/px · z∈[-100,+158]mm · 9 of 80 slices shown (1 of 2)]
[im 1/80]
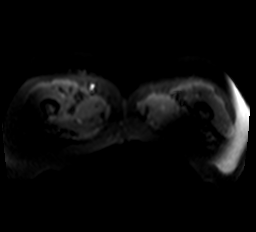
[im 10/80]
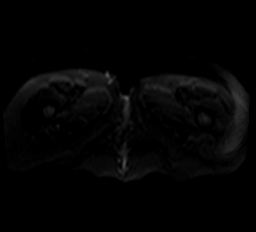
[im 20/80]
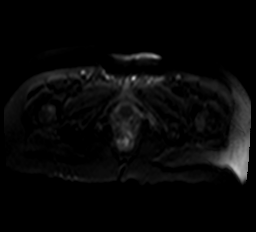
[im 30/80]
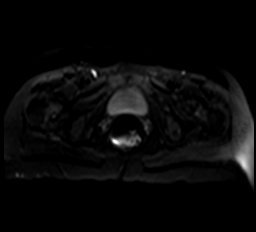
[im 40/80]
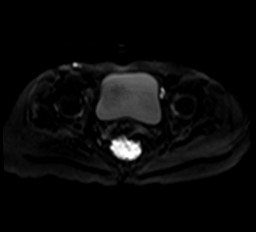
[im 50/80]
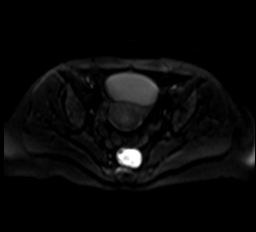
[im 60/80]
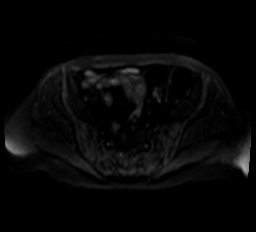
[im 70/80]
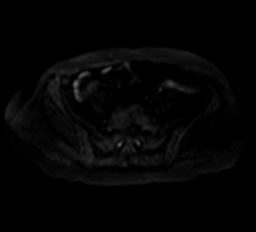
[im 80/80]
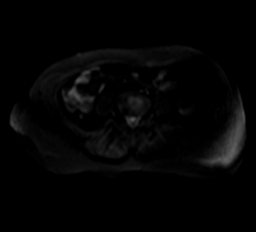

[Series 7: DWI · axial · 5.5mm · 1.48mm/px · z∈[-100,+158]mm · 5 of 40 slices shown (2 of 2)]
[im 1/40]
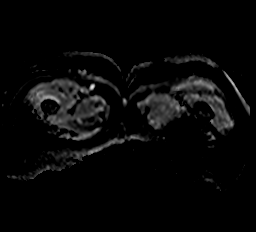
[im 10/40]
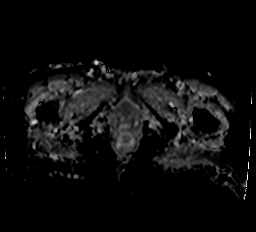
[im 20/40]
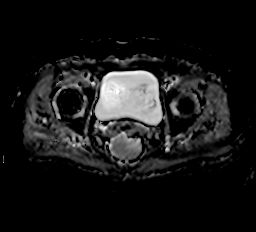
[im 30/40]
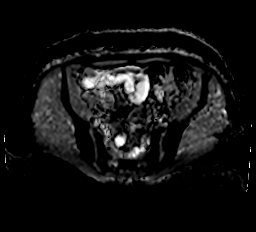
[im 40/40]
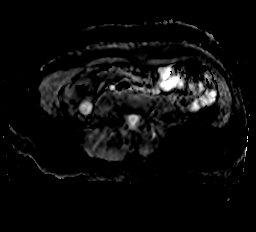

[Series 8: T2 · axial · 3.0mm · 0.70mm/px · z∈[-32,+119]mm · 6 of 56 slices shown (4 of 5)]
[im 1/56]
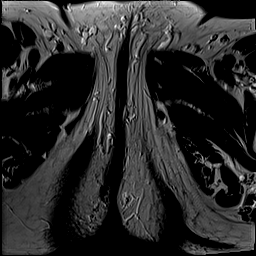
[im 12/56]
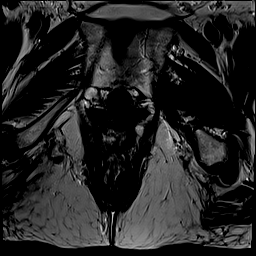
[im 23/56]
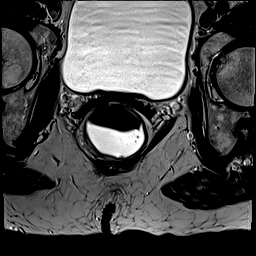
[im 34/56]
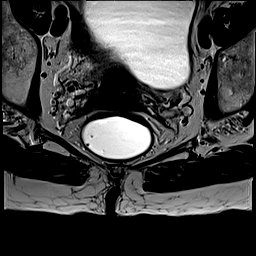
[im 45/56]
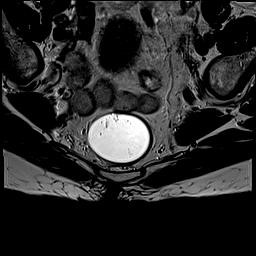
[im 56/56]
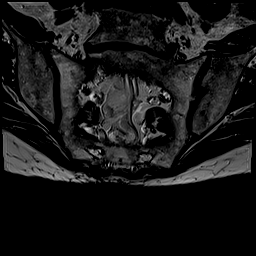

[Series 9: T2 · coronal · 3.0mm · 0.70mm/px · 4 of 34 slices shown (5 of 5)]
[im 1/34]
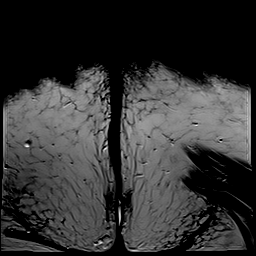
[im 12/34]
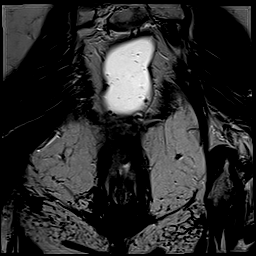
[im 23/34]
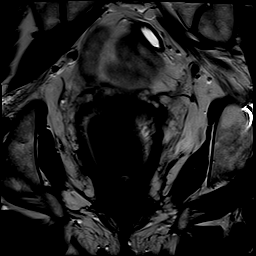
[im 34/34]
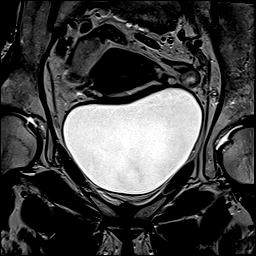

[Series 10: T1 fat-sat post-contrast · axial · 3.0mm · 0.43mm/px · z∈[-17,+65]mm · 4 of 52 slices shown]
[im 1/52]
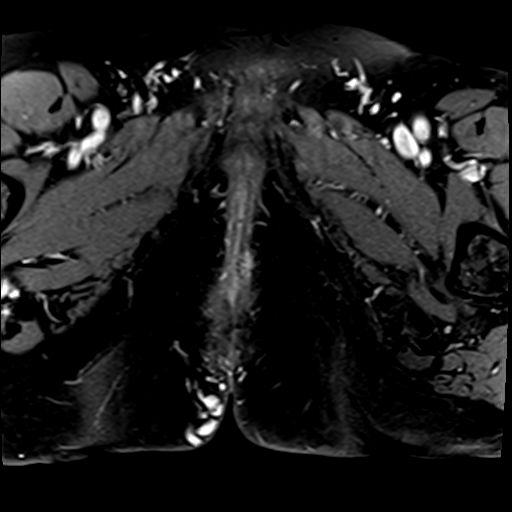
[im 11/52]
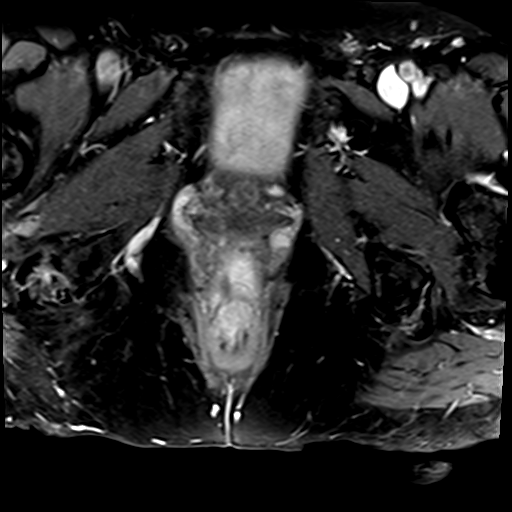
[im 21/52]
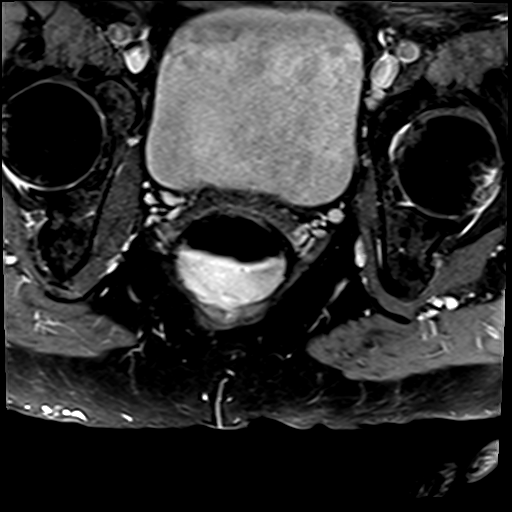
[im 31/52]
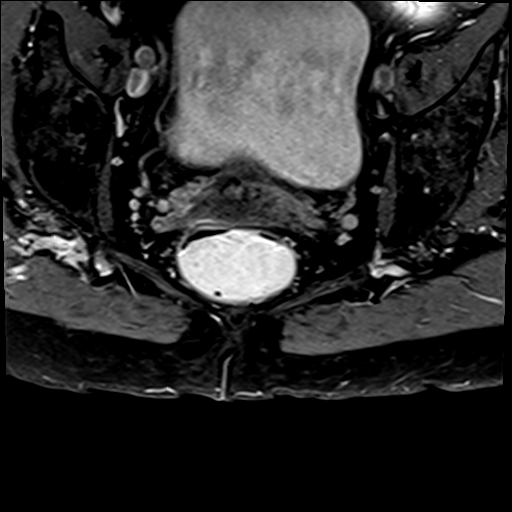

[42 of 48 positions shown; findings below may reference images not displayed]

FINDINGS: Anal tumor is identified. This area is suboptimally evaluated on
this renal protocol exam, secondary to poor anal distension with
enteric contrast.

TUMOR LOCATION

Tumor distance from Anal Verge/Skin Surface: 4.1 cm on [DATE].

Tumor distance from Internal Anal Sphincter: Intimately associated
with the internal sphincter, eccentric right. Felt to contact the
right-sided internal sphincter including on [DATE], [DATE]. No gross
extension through the sphincter.

TUMOR DESCRIPTION

Circumferential Extent: Eccentric right, 180 degrees on [DATE].

Tumor size: On the order of 1.6 x 0.6 cm on 44/10.

No perirectal/perianal adenopathy.  No inguinal adenopathy.

Grossly normal urinary bladder, uterus, adnexa. No free pelvic
fluid.

Grade 1-2 L5-S1 anterolisthesis.
IMPRESSION: 1. Anal 1.6 cm lesion, suboptimally evaluated on this rectal
protocol MRI. Categorized as T1, N0.
2. Intimate association with the internal sphincter, without gross
extension through the sphincter.
3. No imaged pelvic adenopathy.

## 2023-10-09 ENCOUNTER — Other Ambulatory Visit: Payer: Self-pay | Admitting: Nurse Practitioner

## 2023-10-09 ENCOUNTER — Ambulatory Visit
Admission: RE | Admit: 2023-10-09 | Discharge: 2023-10-09 | Disposition: A | Discharge status: Home / Self Care | Payer: Medicare HMO | Source: Ambulatory Visit | Attending: Nurse Practitioner

## 2023-10-09 DIAGNOSIS — M25559 Pain in unspecified hip: Secondary | ICD-10-CM

## 2023-10-29 ENCOUNTER — Other Ambulatory Visit: Payer: Self-pay | Admitting: *Deleted

## 2023-10-29 ENCOUNTER — Encounter: Payer: Self-pay | Admitting: *Deleted

## 2023-10-29 DIAGNOSIS — Z87891 Personal history of nicotine dependence: Secondary | ICD-10-CM

## 2023-10-31 ENCOUNTER — Ambulatory Visit: Payer: Medicare HMO | Admitting: Pulmonary Disease

## 2023-10-31 DIAGNOSIS — Z87891 Personal history of nicotine dependence: Secondary | ICD-10-CM

## 2023-10-31 LAB — PULMONARY FUNCTION TEST
DL/VA % pred: 73 %
DL/VA: 2.97 ml/min/mmHg/L
DLCO cor % pred: 77 %
DLCO cor: 16.54 ml/min/mmHg
DLCO unc % pred: 77 %
DLCO unc: 16.54 ml/min/mmHg
FEF 25-75 Post: 2.07 L/s
FEF 25-75 Pre: 1.49 L/s
FEF2575-%Change-Post: 38 %
FEF2575-%Pred-Post: 101 %
FEF2575-%Pred-Pre: 73 %
FEV1-%Change-Post: 9 %
FEV1-%Pred-Post: 97 %
FEV1-%Pred-Pre: 89 %
FEV1-Post: 2.46 L
FEV1-Pre: 2.26 L
FEV1FVC-%Change-Post: 2 %
FEV1FVC-%Pred-Pre: 92 %
FEV6-%Change-Post: 7 %
FEV6-%Pred-Post: 107 %
FEV6-%Pred-Pre: 100 %
FEV6-Post: 3.42 L
FEV6-Pre: 3.2 L
FEV6FVC-%Change-Post: 0 %
FEV6FVC-%Pred-Post: 103 %
FEV6FVC-%Pred-Pre: 103 %
FVC-%Change-Post: 6 %
FVC-%Pred-Post: 103 %
FVC-%Pred-Pre: 96 %
FVC-Post: 3.44 L
FVC-Pre: 3.23 L
Post FEV1/FVC ratio: 72 %
Post FEV6/FVC ratio: 99 %
Pre FEV1/FVC ratio: 70 %
Pre FEV6/FVC Ratio: 99 %
RV % pred: 129 %
RV: 3.05 L
TLC % pred: 116 %
TLC: 6.38 L

## 2023-10-31 NOTE — Progress Notes (Signed)
Full PFT performed today. °

## 2023-10-31 NOTE — Patient Instructions (Signed)
Full PFT performed today. °

## 2023-11-29 ENCOUNTER — Other Ambulatory Visit: Payer: Self-pay

## 2023-11-29 ENCOUNTER — Encounter: Payer: Self-pay | Admitting: Neurology

## 2023-11-29 DIAGNOSIS — R202 Paresthesia of skin: Secondary | ICD-10-CM

## 2024-01-10 ENCOUNTER — Ambulatory Visit: Payer: Medicare (Managed Care) | Admitting: Neurology

## 2024-01-10 DIAGNOSIS — G5601 Carpal tunnel syndrome, right upper limb: Secondary | ICD-10-CM

## 2024-01-10 DIAGNOSIS — R202 Paresthesia of skin: Secondary | ICD-10-CM

## 2024-01-10 NOTE — Procedures (Signed)
[facility]  [address] [place], [address]  [place], [place] [address] Tel: [phone] Fax: [phone] Test Date:  01/10/2024  Patient: Alison Rojas DOB: [birth year] Physician: Nita Sickle, DO  Sex: Female Height: 5\' 7"  Ref Phys: Luis Abed, DO  ID#: [id number]   Technician:    History: This is a 73 year old female referred for evaluation of bilateral arm paresthesias and pain.  NCV & EMG Findings: Extensive electrodiagnostic testing of the right upper extremity and additional studies of the left shows:  Right mixed palmar sensory response shows mildly prolonged latency.  Left mixed palmar, bilateral median, and bilateral ulnar sensory responses are within normal limits. Bilateral median and ulnar motor responses are within normal limits. There is no evidence of active or chronic motor axonal loss changes affecting any of the tested muscles.  Motor unit configuration and recruitment pattern is within normal limits.  Impression: Right median neuropathy at or distal to the wrist, consistent with a clinical diagnosis of carpal tunnel syndrome.  Overall, these findings are very mild in degree electrically. There is no evidence of a cervical radiculopathy or ulnar neuropathy affecting the upper extremities.   ___________________________ Nita Sickle, DO    Nerve Conduction Studies   Stim Site NR Peak (ms) Norm Peak (ms) O-P Amp (V) Norm O-P Amp  Left Median Anti Sensory (2nd Digit)  32 C  Wrist    3.8 <3.8 32.8 >10  Right Median Anti Sensory (2nd Digit)  32 C  Wrist    3.7 <3.8 28.1 >10  Left Ulnar Anti Sensory (5th Digit)  32 C  Wrist    3.1 <3.2 30.9 >5  Right Ulnar Anti Sensory (5th Digit)  32 C  Wrist    3.2 <3.2 24.6 >5     Stim Site NR Onset (ms) Norm Onset (ms) O-P Amp (mV) Norm O-P Amp Site1 Site2 Delta-0 (ms) Dist (cm) Vel (m/s) Norm Vel (m/s)  Left Median Motor (Abd Poll Brev)  32 C  Wrist    3.3 <4.0 9.6 >5 Elbow Wrist 5.2 28.0 54 >50   Elbow    8.5  9.4         Right Median Motor (Abd Poll Brev)  32 C  Wrist    3.3 <4.0 8.6 >5 Elbow Wrist 5.0 29.0 58 >50  Elbow    8.3  8.1         Left Ulnar Motor (Abd Dig Minimi)  32 C  Wrist    2.8 <3.1 11.5 >7 B Elbow Wrist 4.0 23.0 58 >50  B Elbow    6.8  11.5  A Elbow B Elbow 1.6 10.0 62 >50  A Elbow    8.4  11.2         Right Ulnar Motor (Abd Dig Minimi)  32 C  Wrist    2.9 <3.1 12.2 >7 B Elbow Wrist 3.7 22.0 59 >50  B Elbow    6.6  11.5  A Elbow B Elbow 1.5 10.0 67 >50  A Elbow    8.1  11.1            Stim Site NR Peak (ms) Norm Peak (ms) P-T Amp (V) Site1 Site2 Delta-P (ms) Norm Delta (ms)  Left Median/Ulnar Palm Comparison (Wrist - 8cm)  32 C  Median Palm    2.0 <2.2 77.9 Median Palm Ulnar Palm 0.1   Ulnar Palm    1.9 <2.2 22.8      Right Median/Ulnar Palm Comparison (Wrist - 8cm)  32 C  Median Palm    2.0 <2.2 91.6 Median Palm Ulnar Palm *0.5   Ulnar Palm    1.5 <2.2 30.6       Electromyography   Side Muscle Ins.Act Fibs Fasc Recrt Amp Dur Poly Activation Comment  Right 1stDorInt Nml Nml Nml Nml Nml Nml Nml Nml N/A  Right Abd Poll Brev Nml Nml Nml Nml Nml Nml Nml Nml N/A  Right PronatorTeres Nml Nml Nml Nml Nml Nml Nml Nml N/A  Right Biceps Nml Nml Nml Nml Nml Nml Nml Nml N/A  Right Triceps Nml Nml Nml Nml Nml Nml Nml Nml N/A  Right Deltoid Nml Nml Nml Nml Nml Nml Nml Nml N/A  Left 1stDorInt Nml Nml Nml Nml Nml Nml Nml Nml N/A  Left PronatorTeres Nml Nml Nml Nml Nml Nml Nml Nml N/A  Left Biceps Nml Nml Nml Nml Nml Nml Nml Nml N/A  Left Triceps Nml Nml Nml Nml Nml Nml Nml Nml N/A  Left Deltoid Nml Nml Nml Nml Nml Nml Nml Nml N/A      Waveforms:

## 2024-03-24 ENCOUNTER — Encounter: Payer: Self-pay | Admitting: Neurology

## 2024-03-24 ENCOUNTER — Ambulatory Visit: Payer: Medicare (Managed Care) | Admitting: Neurology

## 2024-03-24 VITALS — BP 136/82 | HR 70 | Ht 67.0 in | Wt 138.0 lb

## 2024-03-24 DIAGNOSIS — R251 Tremor, unspecified: Secondary | ICD-10-CM | POA: Diagnosis not present

## 2024-03-24 DIAGNOSIS — G894 Chronic pain syndrome: Secondary | ICD-10-CM | POA: Diagnosis not present

## 2024-03-24 NOTE — Progress Notes (Signed)
 [facility] Neurology Division Clinic Note - Initial Visit   Date: 03/24/2024   Alison Rojas MRN: [id number] DOB: [birth year]   Dear Dr. Obie Rojas:   Thank you for your kind referral of Alison Rojas for consultation of bilateral arm pain. Although her history is well known to you, please allow [place] to reiterate it for the purpose of our medical record. The patient was accompanied to the clinic by self.    Alison Rojas is a 73 y.o. right-handed female with prediabetes and chronic left > right arm pain presenting for evaluation of bilateral arm pain and abnormal movements.   IMPRESSION/PLAN: Chronic pain involving the upper extremities, worse on the left, where she has history of post-herpetic neuralgia.  She has previously been evaluated by pain management and tried various medications and ultimately choose not to take anything other than medical marijuana, which was prescribed in [place].  NCS/EMG results reviewed with patient which shows very mild right carpal tunnel syndrome, no evidence of radiculopathy.  I offered MRI cervical spine to look for structural pathology, however, she reports that her pain is unchanged and she does not have any new symptoms; therefore, does not want to proceed with imaging.  Management remains supportive.   Shuddering spells are not consistent with seizure disorder or other primary neurological pathology.  It is self-limited and without loss of awareness, weakness, or other neurological symptoms.  It's possible symptoms may be behavorial.   Return to clinic as needed  ------------------------------------------------------------- History of present illness: In 2015, she developed shingles which involved the left chest, back, and left arm.  She was hospitalized for this in [facility], [employer].  Since this time, she has chronic pain involving the same area as well as sometimes in the right arm.  She was seeing pain management who put her on  medical marijuana, which she continues to take.  She does not want to take any oral medications.   Around the same time, she developed neuropathy in the feet.    Starting earlier in 2025, she began having whole body shaking spells, as if she had a cold chill, which lasts a few seconds.  It occurs 1-2 times per day.  No associated pain, weakness, or LOC.    She drinks alcohol several times per week. She works in [employer].   Out-side paper records, electronic medical record, and images have been reviewed where available and summarized as:  NCS/EMG of the arms 01/10/2024: Right median neuropathy at or distal to the wrist, consistent with a clinical diagnosis of carpal tunnel syndrome.  Overall, these findings are very mild in degree electrically. There is no evidence of a cervical radiculopathy or ulnar neuropathy affecting the upper extremities.     Past Medical History:  Diagnosis Date   Anorectal Lesion    Arthritis    Complication of anesthesia 2015   delusions for a few months after right wrist surgery   Fuchs' corneal dystrophy of both eyes    History of anemia    30 yrs ago   history of Anxiety    Rojas's neuroma of both feet    wears inserts both feet   Neuromuscular disorder (HCC)    Osteoporosis    Post-herpetic trigeminal neuralgia    Pre-diabetes    Raynaud disease    Seizure (HCC) 2006   x 1 minor seizure after fall none since per pt 01-16-2022   Shingles 2015   left side    Past Surgical History:  Procedure Laterality  Date   CATARACT EXTRACTION BILATERAL W/ ANTERIOR VITRECTOMY Bilateral 2019   COLONOSCOPY  11/24/2021   dr nandigam   descemets membrane endothelial kertoplasty left 07-28-2021 @ [facility]     corneal tranplant   HEMORRHOID BANDING  2007   LAPAROSCOPIC TUBAL LIGATION  1987   right wrist orif for fracture  2015   TRANSANAL EXCISION OF RECTAL MASS N/A 01/20/2022   Procedure: EXCISION OF DISTAL  RECTAL POLYP;  Surgeon: Romie Levee, MD;  Location:  [facility] [place];  Service: General;  Laterality: N/A;   WISDOM TOOTH EXTRACTION     age 38's     Medications:  Outpatient Encounter Medications as of 03/24/2024  Medication Sig   CALCIUM CITRATE-VITAMIN D PO Take 1 tablet by mouth daily at 6 (six) AM.   cyanocobalamin (VITAMIN B12) 500 MCG tablet Take by mouth.   LORazepam (ATIVAN) 0.5 MG tablet Take 0.5 mg by mouth at bedtime as needed.   Multiple Vitamin (MULTIVITAMIN) capsule Take 1 capsule by mouth daily. gummie   Multiple Vitamins-Minerals (PRESERVISION AREDS PO) Take 1 capsule by mouth daily.   Omega-3 Fatty Acids (FISH OIL) 1000 MG CAPS Take 1 capsule by mouth daily at 6 (six) AM.   OVER THE COUNTER MEDICATION Cbd gummy 1 at bedtime for pain/sleep   Cholecalciferol 50 MCG (2000 UT) TABS Take by mouth.   Ferrous Sulfate (IRON PO) Take 1 tablet by mouth daily at 6 (six) AM. Vit c folic acid   moxifloxacin (VIGAMOX) 0.5 % ophthalmic solution Place 1 drop into the right eye 4 times daily.   traMADol (ULTRAM) 50 MG tablet    UNABLE TO FIND Vitamin d 3 50 mcg (2000 units daily)   No facility-administered encounter medications on file as of 03/24/2024.    Allergies:  Allergies  Allergen Reactions   Codeine Shortness Of Breath   Dairycare [Bacid] Diarrhea and Nausea And Vomiting    Lactose intolerance   Gluten Meal Nausea And Vomiting   Lactose Diarrhea    Family History: Family History  Problem Relation Age of Onset   Vision loss Mother    Cancer Father        Kidney   Diabetes Paternal Grandfather    Colon polyps Neg Hx    Colon cancer Neg Hx    Esophageal cancer Neg Hx    Rectal cancer Neg Hx    Stomach cancer Neg Hx     Social History: Social History   Tobacco Use   Smoking status: Former    Current packs/day: 0.00    Average packs/day: 0.3 packs/day for 15.0 years (3.8 ttl pk-yrs)    Types: Cigarettes    Start date: 12/18/1993    Quit date: 12/18/2008    Years since quitting: 15.2   Smokeless  tobacco: Never  Vaping Use   Vaping status: Never Used  Substance Use Topics   Alcohol use: Yes    Comment: Couple times a week   Drug use: Yes    Frequency: 7.0 times per week    Types: Marijuana    Comment: Smokes medical marijuana   Social History   Social History Narrative   Are you right handed or left handed? Right Handed    Are you currently employed ? 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 Yes   Who lives with you?    What type of home do you live in: 1 story or  2 story? Lives in a one story home        Vital Signs:  BP 136/82   Pulse 70   Ht 5\' 7"  (1.702 m)   Wt 138 lb (62.6 kg)   SpO2 99%   BMI 21.61 kg/m   Neurological Exam: MENTAL STATUS including orientation to time, place, person, recent and remote memory, attention span and concentration, language, and fund of knowledge is normal.  Speech is not dysarthric.  CRANIAL NERVES: II:  No visual field defects.     III-IV-VI: Pupils equal round and reactive to light.  Normal conjugate, extra-ocular eye movements in all directions of gaze.  No nystagmus.  No ptosis.   V:  Normal facial sensation.    VII:  Normal facial symmetry and movements.   VIII:  Normal hearing and vestibular function.   IX-X:  Normal palatal movement.   XI:  Normal shoulder shrug and head rotation.   XII:  Normal tongue strength and range of motion, no deviation or fasciculation.  MOTOR:  No atrophy, fasciculations or abnormal movements.  No pronator drift.   Upper Extremity:  Right  Left  Deltoid  5/5   5/5   Biceps  5/5   5/5   Triceps  5/5   5/5   Wrist extensors  5/5   5/5   Wrist flexors  5/5   5/5   Finger extensors  5/5   5/5   Finger flexors  5/5   5/5   Dorsal interossei  5/5   5/5   Abductor pollicis  5/5   5/5   Tone (Ashworth scale)  0  0   Lower Extremity:  Right  Left  Hip flexors  5/5   5/5   Knee extensors  5/5   5/5   Dorsiflexors  5/5   5/5   Plantarflexors   5/5   5/5   Toe extensors  5/5   5/5   Toe flexors  5/5   5/5   Tone (Ashworth scale)  0  0   MSRs:                                           Right        Left brachioradialis 2+  2+  biceps 2+  2+  triceps 2+  2+  patellar 2+  2+  ankle jerk 0  0  plantar response down  down   SENSORY:  Vibration absent below the ankles, temperature and pin prick reduced in the feet. Romberg's sign present.   COORDINATION/GAIT: Normal finger-to- nose-finger.  Intact rapid alternating movements bilaterally.  Gait is mildly wide-based, stable, unassisted.  Stressed gait intact, unable to perform tandem gait.   Total time spent reviewing records, interview, history/exam, documentation, and coordination of care on day of encounter:  40 minutes  Thank you for allowing me to participate in patient's care.  If I can answer any additional questions, I would be pleased to do so.    Sincerely,    Jiselle Sheu K. Allena Katz, DO

## 2024-04-08 DIAGNOSIS — M25512 Pain in left shoulder: Secondary | ICD-10-CM | POA: Diagnosis not present

## 2024-07-17 ENCOUNTER — Other Ambulatory Visit (HOSPITAL_BASED_OUTPATIENT_CLINIC_OR_DEPARTMENT_OTHER): Payer: Self-pay | Admitting: Family Medicine

## 2024-07-17 DIAGNOSIS — M810 Age-related osteoporosis without current pathological fracture: Secondary | ICD-10-CM

## 2024-07-30 DIAGNOSIS — Z1231 Encounter for screening mammogram for malignant neoplasm of breast: Secondary | ICD-10-CM | POA: Diagnosis not present

## 2024-11-03 DIAGNOSIS — Z23 Encounter for immunization: Secondary | ICD-10-CM | POA: Diagnosis not present

## 2024-11-03 DIAGNOSIS — B0229 Other postherpetic nervous system involvement: Secondary | ICD-10-CM | POA: Diagnosis not present

## 2024-11-03 DIAGNOSIS — F3340 Major depressive disorder, recurrent, in remission, unspecified: Secondary | ICD-10-CM | POA: Diagnosis not present

## 2024-11-03 DIAGNOSIS — G629 Polyneuropathy, unspecified: Secondary | ICD-10-CM | POA: Diagnosis not present

## 2024-11-03 DIAGNOSIS — Z79899 Other long term (current) drug therapy: Secondary | ICD-10-CM | POA: Diagnosis not present

## 2024-11-03 DIAGNOSIS — Z6823 Body mass index (BMI) 23.0-23.9, adult: Secondary | ICD-10-CM | POA: Diagnosis not present

## 2024-11-03 DIAGNOSIS — E782 Mixed hyperlipidemia: Secondary | ICD-10-CM | POA: Diagnosis not present

## 2024-11-03 DIAGNOSIS — F5104 Psychophysiologic insomnia: Secondary | ICD-10-CM | POA: Diagnosis not present

## 2024-11-03 DIAGNOSIS — F411 Generalized anxiety disorder: Secondary | ICD-10-CM | POA: Diagnosis not present

## 2024-11-03 DIAGNOSIS — R296 Repeated falls: Secondary | ICD-10-CM | POA: Diagnosis not present

## 2024-11-03 DIAGNOSIS — R7303 Prediabetes: Secondary | ICD-10-CM | POA: Diagnosis not present

## 2024-11-28 ENCOUNTER — Encounter: Payer: Self-pay | Admitting: Gastroenterology

## 2024-12-25 ENCOUNTER — Ambulatory Visit: Payer: Medicare (Managed Care)

## 2024-12-25 VITALS — Ht 67.0 in | Wt 151.0 lb

## 2024-12-25 DIAGNOSIS — Z860100 Personal history of colon polyps, unspecified: Secondary | ICD-10-CM

## 2024-12-25 MED ORDER — NA SULFATE-K SULFATE-MG SULF 17.5-3.13-1.6 GM/177ML PO SOLN: 1.0000 | Freq: Once | ORAL | 0 refills | Status: AC

## 2024-12-25 NOTE — Progress Notes (Signed)
 PCP MD at time of PV: Olam Pinal, MD __________________________________________________________________________________________________________________________________________  No egg allergy known to patient  No soy allergy known to patient Hx of Delusions with past sedation with any surgeries or procedures. Last colonoscopy in 2022 pt reports doing fine.  Patient denies ever being told they had issues or difficulty with intubation  No FH of Malignant Hyperthermia Pt is not on diet pills Pt is not on  home 02  Pt is not on blood thinners  No A fib or A flutter Have any cardiac testing pending-- no  LOA: independent  No Chew or Snuff tobacco __________________________________________________________________________________________________________________________________________  Constipation:  no  Prep: suprep  __________________________________________________________________________________________________________________________________________  PV completed with patient. Prep instructions reviewed and provided during apt. Rx sent to preferred pharmacy.  __________________________________________________________________________________________________________________________________________  Patient's chart reviewed by Norleen Schillings CNRA prior to previsit and patient appropriate for the [facility].  Previsit completed and red dot placed by patient's name on their procedure day (on provider's schedule).

## 2024-12-30 ENCOUNTER — Other Ambulatory Visit: Payer: Self-pay

## 2024-12-30 ENCOUNTER — Emergency Department (HOSPITAL_BASED_OUTPATIENT_CLINIC_OR_DEPARTMENT_OTHER): Payer: Medicare (Managed Care)

## 2024-12-30 ENCOUNTER — Emergency Department (HOSPITAL_BASED_OUTPATIENT_CLINIC_OR_DEPARTMENT_OTHER): Payer: Medicare (Managed Care) | Admitting: Radiology

## 2024-12-30 ENCOUNTER — Encounter: Payer: Self-pay | Admitting: Gastroenterology

## 2024-12-30 ENCOUNTER — Encounter (HOSPITAL_BASED_OUTPATIENT_CLINIC_OR_DEPARTMENT_OTHER): Payer: Self-pay | Admitting: Emergency Medicine

## 2024-12-30 ENCOUNTER — Emergency Department (HOSPITAL_BASED_OUTPATIENT_CLINIC_OR_DEPARTMENT_OTHER)
Admission: EM | Admit: 2024-12-30 | Discharge: 2024-12-30 | Disposition: A | Discharge status: Home / Self Care | Payer: Medicare (Managed Care) | Attending: Emergency Medicine | Admitting: Emergency Medicine

## 2024-12-30 DIAGNOSIS — W19XXXA Unspecified fall, initial encounter: Secondary | ICD-10-CM

## 2024-12-30 DIAGNOSIS — S7001XA Contusion of right hip, initial encounter: Secondary | ICD-10-CM | POA: Diagnosis not present

## 2024-12-30 DIAGNOSIS — W01198A Fall on same level from slipping, tripping and stumbling with subsequent striking against other object, initial encounter: Secondary | ICD-10-CM | POA: Insufficient documentation

## 2024-12-30 DIAGNOSIS — S79911A Unspecified injury of right hip, initial encounter: Secondary | ICD-10-CM | POA: Diagnosis present

## 2024-12-30 DIAGNOSIS — S0990XA Unspecified injury of head, initial encounter: Secondary | ICD-10-CM | POA: Diagnosis not present

## 2024-12-30 MED ORDER — DICLOFENAC SODIUM 75 MG PO TBEC: 75.0000 mg | DELAYED_RELEASE_TABLET | Freq: Two times a day (BID) | ORAL | 0 refills | Status: AC

## 2024-12-30 NOTE — ED Provider Notes (Signed)
 " [place] [facility] AT [facility] Provider Note   CSN: [id number] Arrival date & time: 12/30/24  1553     Patient presents with: [place]   Alison Rojas is a 74 y.o. female who has pain to the right hip as well as headache after fall at home on Sunday night.  States that she did hit the head but denies any loss of consciousness.  {Add pertinent medical, surgical, social history, OB history to YEP:67052}  Fall       Prior to Admission medications  Medication Sig Start Date End Date Taking? Authorizing Provider  diclofenac  (VOLTAREN ) 75 MG EC tablet Take 1 tablet (75 mg total) by mouth 2 (two) times daily. 12/30/24  Yes Myriam Dorn BROCKS, PA  buPROPion (WELLBUTRIN XL) 300 MG 24 hr tablet Take 300 mg by mouth daily.    [provider]  CALCIUM MAGNESIUM ZINC PO Take 1 tablet by mouth daily.    [provider]  Cholecalciferol 10 MCG (400 UNIT) CAPS Take 1 capsule by mouth daily.    [provider]  DULoxetine (CYMBALTA) 30 MG capsule Take 30 mg by mouth daily. 11/03/24   [provider]  lidocaine  (LIDODERM ) 5 % Place 1 patch onto the skin daily. 08/12/24   [provider]  Multiple Vitamin (MULTIVITAMIN) capsule Take 1 capsule by mouth daily. gummie    [provider]  Multiple Vitamins-Minerals (PRESERVISION AREDS PO) Take 1 capsule by mouth daily.    [provider]  pravastatin (PRAVACHOL) 10 MG tablet Take 10 mg by mouth daily.    [provider]    Allergies: Codeine, Gluten meal, and Lactose    Review of Systems  Updated Vital Signs BP 110/74 (BP Location: Right Arm)   Pulse 64   Temp 98.3 F (36.8 C) (Oral)   Resp 15   SpO2 100%   Physical Exam  (all labs ordered are listed, but only abnormal results are displayed) Labs Reviewed - No data to display  EKG: None  Radiology: DG Hip Unilat W or Wo Pelvis 2-3 Views Right Result Date: 12/30/2024 CLINICAL DATA:  Fall 4 days  ago. EXAM: DG HIP (WITH OR WITHOUT PELVIS) 2-3V RIGHT COMPARISON:  10/09/2023 FINDINGS: Minimal symmetric degenerative change of the hips. No acute fracture or dislocation. No focal lytic or sclerotic lesions. Mild degenerate change of the spine and sacroiliac joints. IMPRESSION: 1. No acute findings. 2. Minimal symmetric degenerative change of the hips. Electronically Signed   By: Toribio Agreste M.D.   On: 12/30/2024 16:37   CT Cervical Spine Wo Contrast Result Date: 12/30/2024 EXAM: CT CERVICAL SPINE WITHOUT CONTRAST 12/30/2024 04:24:23 PM TECHNIQUE: CT of the cervical spine was performed without the administration of intravenous contrast. Multiplanar reformatted images are provided for review. Automated exposure control, iterative reconstruction, and/or weight based adjustment of the mA/kV was utilized to reduce the radiation dose to as low as reasonably achievable. COMPARISON: CT head reported separately today. Prior cervical spine radiographs 12/18/2021. CLINICAL HISTORY: 74 year old female. Fall 2 nights ago striking head. FINDINGS: BONES AND ALIGNMENT: Chronic straightening of cervical lordosis. Trace degenerative appearing anterolisthesis of C2 on C3 is chronic but progressed. No acute fracture or traumatic malalignment. DEGENERATIVE CHANGES: Widespread chronic and severe cervical disc and endplate degeneration. Vacuum phenomenon at both the C3-C4 and C4-C5 levels. Posterior disc osteophyte complex and cervical spinal stenosis by CT appear most pronounced at C5-C6 (probably mild spinal stenosis there). Limited facet degeneration primarily at C2-C3 on the left. SOFT  TISSUES: No prevertebral soft tissue swelling. Visible sphenoid sinuses, tympanic cavities and mastoids are clear. Partially retropharyngeal course of the right carotid, normal variant. Otherwise negative visible non-contrast neck soft tissues. THORACIC INLET: Mild apical lung scarring. Otherwise negative visible non-contrast thoracic inlet.  ARTIFACTS: Mild neckless artifact in the lower neck. IMPRESSION: 1. No acute traumatic injury identified in the cervical spine. 2. 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 Electronically signed by: Helayne Hurst MD 12/30/2024 04:33 PM EST RP Workstation: [id number]   CT Head Wo Contrast Result Date: 12/30/2024 CLINICAL DATA:  [place] on "Sunday, hit head EXAM: CT HEAD WITHOUT CONTRAST TECHNIQUE: Contiguous axial images were obtained from the base of the skull through the vertex without intravenous contrast. RADIATION DOSE REDUCTION: This exam was performed according to the departmental dose-optimization program which includes automated exposure control, adjustment of the mA and/or kV according to patient size and/or use of iterative reconstruction technique. COMPARISON:  None Available. FINDINGS: Brain: No acute infarct or hemorrhage. Prominence of the lateral ventricles out of proportion to the degree of cerebral atrophy. Midline structures are unremarkable. No acute extra-axial fluid collections. No mass effect. Vascular: No hyperdense vessel or unexpected calcification. Skull: Normal. Negative for fracture or focal lesion. Sinuses/Orbits: No acute finding. Other: None. IMPRESSION: 1. No acute intracranial process. Electronically Signed   By: Michael  Brown M.D.   On: 12/30/2024 16:28    {Document cardiac monitor, telemetry assessment procedure when appropriate:32947} Procedures   Medications Ordered in the ED - No data to display    {Click here for ABCD2, HEART and other calculators REFRESH Note before signing:1}                              Medical Decision Making Amount and/or Complexity of Data Reviewed Radiology: ordered.  Risk Prescription drug management.   ***  {Document critical care time when appropriate  Document review of labs and clinical decision tools ie CHADS2VASC2, etc  Document your independent review of radiology images and any  outside records  Document your discussion with family members, caretakers and with consultants  Document social determinants of health affecting pt's care  Document your decision making why or why not admission, treatments were needed:32947:::1}   Final diagnoses:  Contusion of right hip, initial encounter  Fall, initial encounter    ED Discharge Orders          Ordered    diclofenac (VOLTAREN) 75 MG EC tablet  2 times daily        01" /13/26 2040             "

## 2024-12-30 NOTE — Discharge Instructions (Signed)
 In addition to using the prescribed medication, you can continue to use acetaminophen  as needed, as well as using Lidoderm  patches to the affected area.  Recommend he follow-up with your primary care within next several weeks just for preventative medicine purposes otherwise if any further concerns you can come back to the emergency department for further assessment.

## 2024-12-30 NOTE — ED Triage Notes (Signed)
 Reports mechanical fall on Sunday night, hit head. Denies LOC. Denies thinners.

## 2025-01-06 ENCOUNTER — Ambulatory Visit (HOSPITAL_BASED_OUTPATIENT_CLINIC_OR_DEPARTMENT_OTHER)
Admission: RE | Admit: 2025-01-06 | Discharge: 2025-01-06 | Disposition: A | Discharge status: Home / Self Care | Payer: Medicare (Managed Care) | Source: Ambulatory Visit | Attending: Family Medicine | Admitting: Family Medicine

## 2025-01-06 DIAGNOSIS — M810 Age-related osteoporosis without current pathological fracture: Secondary | ICD-10-CM | POA: Insufficient documentation

## 2025-01-08 ENCOUNTER — Encounter: Payer: Medicare (Managed Care) | Admitting: Gastroenterology

## 2025-04-14 ENCOUNTER — Ambulatory Visit: Payer: Self-pay | Admitting: Neurology

## 2025-04-14 ENCOUNTER — Other Ambulatory Visit (HOSPITAL_BASED_OUTPATIENT_CLINIC_OR_DEPARTMENT_OTHER): Payer: Medicare (Managed Care)
